# Patient Record
Sex: Female | Born: 1945 | Race: White | Hispanic: No | Marital: Married | Smoking: Never smoker
Health system: Southern US, Community
[De-identification: ages and names within clinical notes are randomized; demographics above are authoritative.]

## PROBLEM LIST (undated history)

## (undated) DIAGNOSIS — G2581 Restless legs syndrome: Secondary | ICD-10-CM

## (undated) DIAGNOSIS — M1990 Unspecified osteoarthritis, unspecified site: Secondary | ICD-10-CM

## (undated) DIAGNOSIS — Z9889 Other specified postprocedural states: Secondary | ICD-10-CM

## (undated) DIAGNOSIS — R112 Nausea with vomiting, unspecified: Secondary | ICD-10-CM

## (undated) HISTORY — PX: TONSILLECTOMY: SUR1361

## (undated) HISTORY — PX: APPENDECTOMY: SHX54

## (undated) HISTORY — PX: TUBAL LIGATION: SHX77

---

## 1997-12-22 ENCOUNTER — Other Ambulatory Visit: Admission: RE | Admit: 1997-12-22 | Discharge: 1997-12-22 | Payer: Self-pay | Admitting: Obstetrics and Gynecology

## 2000-01-16 ENCOUNTER — Encounter: Admission: RE | Admit: 2000-01-16 | Discharge: 2000-01-16 | Payer: Self-pay | Admitting: Obstetrics and Gynecology

## 2000-01-16 ENCOUNTER — Encounter: Payer: Self-pay | Admitting: Obstetrics and Gynecology

## 2001-01-16 ENCOUNTER — Encounter: Payer: Self-pay | Admitting: Obstetrics and Gynecology

## 2001-01-16 ENCOUNTER — Encounter: Admission: RE | Admit: 2001-01-16 | Discharge: 2001-01-16 | Payer: Self-pay | Admitting: Obstetrics and Gynecology

## 2002-01-17 ENCOUNTER — Encounter: Payer: Self-pay | Admitting: Obstetrics and Gynecology

## 2002-01-17 ENCOUNTER — Encounter: Admission: RE | Admit: 2002-01-17 | Discharge: 2002-01-17 | Payer: Self-pay | Admitting: Obstetrics and Gynecology

## 2002-02-19 ENCOUNTER — Other Ambulatory Visit: Admission: RE | Admit: 2002-02-19 | Discharge: 2002-02-19 | Payer: Self-pay | Admitting: Obstetrics and Gynecology

## 2002-12-20 ENCOUNTER — Encounter (INDEPENDENT_AMBULATORY_CARE_PROVIDER_SITE_OTHER): Payer: Self-pay | Admitting: *Deleted

## 2002-12-20 ENCOUNTER — Ambulatory Visit (HOSPITAL_COMMUNITY): Admission: RE | Admit: 2002-12-20 | Discharge: 2002-12-20 | Payer: Self-pay | Admitting: Obstetrics and Gynecology

## 2003-01-26 ENCOUNTER — Encounter: Admission: RE | Admit: 2003-01-26 | Discharge: 2003-01-26 | Payer: Self-pay | Admitting: Obstetrics and Gynecology

## 2003-01-26 ENCOUNTER — Encounter: Payer: Self-pay | Admitting: Obstetrics and Gynecology

## 2003-03-16 ENCOUNTER — Other Ambulatory Visit: Admission: RE | Admit: 2003-03-16 | Discharge: 2003-03-16 | Payer: Self-pay | Admitting: Obstetrics and Gynecology

## 2004-03-28 ENCOUNTER — Other Ambulatory Visit: Admission: RE | Admit: 2004-03-28 | Discharge: 2004-03-28 | Payer: Self-pay | Admitting: Obstetrics and Gynecology

## 2005-05-03 ENCOUNTER — Other Ambulatory Visit: Admission: RE | Admit: 2005-05-03 | Discharge: 2005-05-03 | Payer: Self-pay | Admitting: Obstetrics and Gynecology

## 2008-07-29 ENCOUNTER — Ambulatory Visit: Payer: Self-pay | Admitting: Vascular Surgery

## 2009-01-04 ENCOUNTER — Emergency Department (HOSPITAL_COMMUNITY): Admission: EM | Admit: 2009-01-04 | Discharge: 2009-01-04 | Payer: Self-pay | Admitting: Emergency Medicine

## 2010-10-27 ENCOUNTER — Ambulatory Visit
Admission: RE | Admit: 2010-10-27 | Discharge: 2010-10-27 | Disposition: A | Discharge status: Home / Self Care | Payer: BC Managed Care – PPO | Source: Ambulatory Visit | Attending: Family Medicine | Admitting: Family Medicine

## 2010-10-27 ENCOUNTER — Other Ambulatory Visit: Payer: Self-pay | Admitting: Family Medicine

## 2010-10-27 DIAGNOSIS — R52 Pain, unspecified: Secondary | ICD-10-CM

## 2011-01-03 NOTE — Assessment & Plan Note (Signed)
OFFICE VISIT   [name], [name]  DOB:  1946/03/10                                       07/29/2008  CHART#:[id number]   The patient is a 65 year old female referred for evaluation for  abdominal aortic aneurysm.  Her mother died of a ruptured abdominal  aortic aneurysm at age 56.  The patient's atherosclerotic risk factors  include family history and history of smoking.  She denies history of  coronary artery disease, hypertension, diabetes or elevated cholesterol.  She has no other first degree relatives that she knows of that had an  aneurysm.   PHYSICAL EXAMINATION:  Today, blood pressure is 154/80 in the left arm,  pulse is 75 and regular, respirations 18.  Abdomen:  Soft, nontender,  nondistended with no evidence of pulsatile mass.  She has 2+ dorsalis  pedis and posterior tibial pulses bilaterally.  She had an aortic  ultrasound today which showed her maximum aortic diameter was 1.9 cm,  which is within normal limits.  She had no evidence of abdominal or  iliac aneurysm.   I counseled the patient today that she should have a repeat ultrasound  in 5 years to make sure that she has no aneurysm.  If she has no  aneurysm at age 62, the risk of developing one long-term should be  fairly low.  I also counseled her about refraining from smoking and  modification of atherosclerotic risk factors and discussed with her risk  factors that were related to abdominal aortic aneurysm development.  She  will follow up in 5 years' time for a repeat ultrasound.   Janetta Hora. Fields, MD  Electronically Signed   CEF/MEDQ  D:  07/29/2008  T:  07/30/2008  Job:  [id number]   cc:   Duncan Dull, M.D.  Richard M. Marcelle Overlie, M.D.

## 2011-01-03 NOTE — Procedures (Signed)
DUPLEX ULTRASOUND OF ABDOMINAL AORTA   INDICATION:  Family history of abdominal aortic aneurysm.   HISTORY:  Diabetes:  Yes.  Cardiac:  No.  Hypertension:  Yes.  Smoking:  Quit.  Connective Tissue Disorder:  Family History:  Mother died of abdominal aortic aneurysm.  Previous Surgery:   DUPLEX EXAM:         AP (cm)                   TRANSVERSE (cm)  Proximal             1.78 cm                   1.80 cm  Mid                  1.90 cm                   1.84 cm  Distal               1.66 cm                   1.52 cm  Right Iliac          1.18 cm                   1.04 cm  Left Iliac           1.29 cm                   0.90 cm   PREVIOUS:  Date:  AP:  TRANSVERSE:   IMPRESSION:  No evidence of abdominal aortic aneurysm noted.   ___________________________________________  Janetta Hora Fields, MD   MG/MEDQ  D:  07/29/2008  T:  07/29/2008  Job:  [id number]

## 2011-01-06 NOTE — Op Note (Signed)
   NAME:  Katie [name], Katie [name]                       ACCOUNT NO.:  [id number]   MEDICAL RECORD NO.:  [id number]                   PATIENT TYPE:  AMB   LOCATION:  SDC                                  FACILITY:  [facility]   PHYSICIAN:  Duke Salvia. Marcelle Overlie, M.D.            DATE OF BIRTH:  Jul 01, 1946   DATE OF PROCEDURE:  12/20/2002  DATE OF DISCHARGE:                                 OPERATIVE REPORT   PREOPERATIVE DIAGNOSES:  1. Hyperplasia.  2. Abnormal bleeding.   POSTOPERATIVE DIAGNOSES:  1. Hyperplasia.  2. Abnormal bleeding.  3. Endometrial polyp.   PROCEDURE:  Dilatation and curettage with hysteroscopy.   SURGEON:  Duke Salvia. Marcelle Overlie, M.D.   ANESTHESIA:  Paracervical block with sedation.   PROCEDURE AND FINDINGS:  The patient had been taken to the operating room.  After an adequate level of sedation was obtained with the patient's legs in  stirrups, the perineum and vagina were prepped and draped sterilely with  Betadine, the bladder was drained, [name] UA carried out, the uterus midposition,  normal size, mobile, adnexa negative.  The speculum was positioned, the  cervix grasped with [name] tenaculum, paracervical block created by infiltrating  at 3 and 9 o'clock submucosally.  Xylocaine 1% 5-7 mL on either side after  negative aspiration.  The uterus was then sounded to 8 cm, progressively  dilated to [name] 29 Pratt.  [name] 7 mm continuous-flow diagnostic hysteroscope was  then inserted.  Several polyps were noted in the lower segment.  The scope  was removed.  D&C was carried out with removal of some polypoid tissue.  When no further tissue was removed and the walls were felt to be clean, the  scope was reinserted, the cavity irrigated and inspected carefully.  The  polyps had been removed.  There were no other abnormalities noted.  She  tolerated this well and went to the recovery room in good condition.                                               Richard M. Marcelle Overlie, M.D.    RMH/MEDQ  D:   12/20/2002  T:  12/20/2002  Job:  [id number]

## 2011-02-24 ENCOUNTER — Ambulatory Visit (HOSPITAL_BASED_OUTPATIENT_CLINIC_OR_DEPARTMENT_OTHER)
Admission: RE | Admit: 2011-02-24 | Discharge: 2011-02-24 | Disposition: A | Discharge status: Home / Self Care | Payer: BC Managed Care – PPO | Source: Ambulatory Visit | Attending: Specialist | Admitting: Specialist

## 2011-02-24 DIAGNOSIS — M23359 Other meniscus derangements, posterior horn of lateral meniscus, unspecified knee: Secondary | ICD-10-CM | POA: Insufficient documentation

## 2011-02-24 DIAGNOSIS — Z01810 Encounter for preprocedural cardiovascular examination: Secondary | ICD-10-CM | POA: Insufficient documentation

## 2011-02-24 DIAGNOSIS — M23305 Other meniscus derangements, unspecified medial meniscus, unspecified knee: Secondary | ICD-10-CM | POA: Insufficient documentation

## 2011-02-24 DIAGNOSIS — Z01812 Encounter for preprocedural laboratory examination: Secondary | ICD-10-CM | POA: Insufficient documentation

## 2011-02-24 DIAGNOSIS — M1710 Unilateral primary osteoarthritis, unspecified knee: Secondary | ICD-10-CM | POA: Insufficient documentation

## 2011-02-24 DIAGNOSIS — M2240 Chondromalacia patellae, unspecified knee: Secondary | ICD-10-CM | POA: Insufficient documentation

## 2011-02-24 LAB — POCT HEMOGLOBIN-HEMACUE: Hemoglobin: 12.7 g/dL (ref 12.0–15.0)

## 2011-03-05 NOTE — Op Note (Signed)
Katie [name], Katie [name]            ACCOUNT NO.:  [id number]  MEDICAL RECORD NO.:  [id number]  LOCATION:                                 FACILITY:  PHYSICIAN:  Jene Every, M.D.         DATE OF BIRTH:  DATE OF PROCEDURE:  02/24/2011 DATE OF DISCHARGE:                              OPERATIVE REPORT   PREOPERATIVE DIAGNOSES: 1. Lateral meniscus tear. 2. Degenerative joint disease.  POSTOPERATIVE DIAGNOSES: 1. Complex tear lateral meniscus. 2. 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  PROCEDURES PERFORMED: 1. Right knee arthroscopy. 2. Partial medial and lateral meniscectomies. 3. Chondroplasty of the medial femoral condyle, lateral femoral     condyle.  ANESTHESIA:  General.  BRIEF HISTORY:  A 65 year old, locking, popping, giving way.  MRI indicating complex tear of lateral meniscus, minor degenerative changes, refractory conservative treatment, mechanical symptoms, indicated for arthroscopic partial meniscectomy and debridement.  Risks and benefits discussed, including bleeding, infection, no change in symptoms, worsening symptoms, need for repeat debridement, DVT, PE, anesthetic complications, need for revision total knee.  TECHNIQUE:  Patient in supine position after induction of adequate general anesthesia, 1 g of Kefzol, the right lower extremity was prepped and draped in the usual sterile fashion.  A lateral parapatellar portal was fashioned with a #11 blade.  Ingress cannula was atraumatically placed.  Irrigant was utilized to insufflate the joint.  Under direct visualization, a medial parapatellar portal was fashioned with a #11 blade sparing the medial meniscus after localization with an 18 gauge needle.  Examination of the medial compartment revealed some minor grade III changes of the femoral condyles, small areas of  radial tearing of the medial meniscus.  Katie [name] was introduced, a 3.5 Cuda shaver, and debrided the meniscus tear to a stable base.  Light chondroplasty was performed of the femoral condyle.  We evacuated loose cartilaginous debris.  The remnant of the meniscus was stable to probe palpation.  Examination of the intercondylar notch revealed normal ACL, hyperemic, intact.  Examination of the lateral compartment revealed extensive complex tear of the posterior half to third of the lateral meniscus.  This was complex tear, multiple cleavage planes back to the meniscocapsular junction.  There was loose cartilaginous debris, a small grade IV 4 lesion on the posterior aspect of the femoral condyle, grade III lesion noted as well.  Light chondroplasty was performed of the femoral condyle.  I introduced the basket rongeur and resected the meniscus to a stable base further contoured with a 3.5 Cuda shaver and an ArthroWand. The remnant, which was approximately 30% of the posterior third was stable to probe palpation.  With flexion extended the joint, there was no interpositioning.  There was no further loose cartilaginous debris.  Next, examined the suprapatellar pouch, minor grade III changes, normal patellofemoral tracking, light chondroplasty performed of the patella. Gutters were unremarkable.  Copiously lavaged and reexamined all compartments.  No further pathology amenable for arthroscopic intervention and therefore removed all instrumentation.  Portals were closed with 4-0 nylon simple sutures, 0.25%  Marcaine with epinephrine was infiltrated in the joint.  The wound was dressed sterilely.  Awoken without difficulty, transported to the recovery room in satisfactory condition.  The patient tolerated the procedure well.  No complications.  No assistant.     Jene Every, M.D.     Katie [name]  D:  02/24/2011  T:  02/24/2011  Job:  [id number]  Electronically Signed by Jene Every M.D.  on 03/05/2011 09:53:36 AM

## 2012-08-08 ENCOUNTER — Emergency Department (HOSPITAL_COMMUNITY)
Admission: EM | Admit: 2012-08-08 | Discharge: 2012-08-08 | Disposition: A | Discharge status: Home / Self Care | Payer: BC Managed Care – PPO | Attending: Emergency Medicine | Admitting: Emergency Medicine

## 2012-08-08 ENCOUNTER — Emergency Department (HOSPITAL_COMMUNITY): Payer: BC Managed Care – PPO

## 2012-08-08 ENCOUNTER — Encounter (HOSPITAL_COMMUNITY): Payer: Self-pay | Admitting: *Deleted

## 2012-08-08 DIAGNOSIS — S42209A Unspecified fracture of upper end of unspecified humerus, initial encounter for closed fracture: Secondary | ICD-10-CM | POA: Insufficient documentation

## 2012-08-08 DIAGNOSIS — Z7982 Long term (current) use of aspirin: Secondary | ICD-10-CM | POA: Insufficient documentation

## 2012-08-08 DIAGNOSIS — R110 Nausea: Secondary | ICD-10-CM | POA: Insufficient documentation

## 2012-08-08 DIAGNOSIS — S0181XA Laceration without foreign body of other part of head, initial encounter: Secondary | ICD-10-CM

## 2012-08-08 DIAGNOSIS — R42 Dizziness and giddiness: Secondary | ICD-10-CM | POA: Insufficient documentation

## 2012-08-08 DIAGNOSIS — W1809XA Striking against other object with subsequent fall, initial encounter: Secondary | ICD-10-CM | POA: Insufficient documentation

## 2012-08-08 DIAGNOSIS — Y9389 Activity, other specified: Secondary | ICD-10-CM | POA: Insufficient documentation

## 2012-08-08 DIAGNOSIS — Y929 Unspecified place or not applicable: Secondary | ICD-10-CM | POA: Insufficient documentation

## 2012-08-08 DIAGNOSIS — Z79899 Other long term (current) drug therapy: Secondary | ICD-10-CM | POA: Insufficient documentation

## 2012-08-08 DIAGNOSIS — Z8669 Personal history of other diseases of the nervous system and sense organs: Secondary | ICD-10-CM | POA: Insufficient documentation

## 2012-08-08 DIAGNOSIS — S0180XA Unspecified open wound of other part of head, initial encounter: Secondary | ICD-10-CM | POA: Insufficient documentation

## 2012-08-08 HISTORY — DX: Restless legs syndrome: G25.81

## 2012-08-08 MED ORDER — MORPHINE SULFATE 4 MG/ML IJ SOLN
4.0000 mg | Freq: Once | INTRAMUSCULAR | Status: AC
  Administered 2012-08-08: 4 mg via INTRAMUSCULAR

## 2012-08-08 MED ORDER — ONDANSETRON HCL 4 MG/2ML IJ SOLN
4.0000 mg | Freq: Once | INTRAMUSCULAR | Status: AC
  Administered 2012-08-08: 4 mg via INTRAVENOUS
  Filled 2012-08-08: qty 2

## 2012-08-08 MED ORDER — MORPHINE SULFATE 4 MG/ML IJ SOLN
INTRAMUSCULAR | Status: AC
  Filled 2012-08-08: qty 1

## 2012-08-08 MED ORDER — FENTANYL CITRATE 0.05 MG/ML IJ SOLN
50.0000 ug | Freq: Once | INTRAMUSCULAR | Status: AC
  Administered 2012-08-08: 50 ug via INTRAVENOUS

## 2012-08-08 MED ORDER — FENTANYL CITRATE 0.05 MG/ML IJ SOLN
100.0000 ug | Freq: Once | INTRAMUSCULAR | Status: AC
  Administered 2012-08-08: 100 ug via INTRAVENOUS

## 2012-08-08 MED ORDER — HYDROCODONE-ACETAMINOPHEN 5-325 MG PO TABS: 1.0000 | ORAL_TABLET | ORAL | Status: DC | PRN

## 2012-08-08 MED ORDER — ONDANSETRON HCL 4 MG PO TABS: 4.0000 mg | ORAL_TABLET | Freq: Four times a day (QID) | ORAL | Status: DC

## 2012-08-08 MED ORDER — FENTANYL CITRATE 0.05 MG/ML IJ SOLN
50.0000 ug | Freq: Once | INTRAMUSCULAR | Status: AC
  Administered 2012-08-08: 50 ug via INTRAVENOUS
  Filled 2012-08-08: qty 2

## 2012-08-08 MED ORDER — FENTANYL CITRATE 0.05 MG/ML IJ SOLN
INTRAMUSCULAR | Status: AC
  Filled 2012-08-08: qty 2

## 2012-08-08 NOTE — ED Notes (Signed)
Pt felt nauseous with some dizziness upon sitting up; pt asked to take a couple of breaths; upon arrival outside to vehicle, pt stated that she felt better with fresh air; pt more alert, respirations 16; pt d/c with daughter

## 2012-08-08 NOTE — ED Notes (Signed)
Patient is still out of her room
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
## 2012-08-08 NOTE — Progress Notes (Signed)
Orthopedic Tech Progress Note Patient Details:  Katie [name] September 29, 1945 [id number]  Ortho Devices Type of Ortho Device: Arm sling;Post (long arm) splint Ortho Device/Splint Location: RIGHT LONG ARM SPLILNT AND ARM SLING Ortho Device/Splint Interventions: Application   Cammer, Mickie Bail 08/08/2012, 1:56 PM

## 2012-08-08 NOTE — ED Notes (Signed)
Patient remains in CT at this time 

## 2012-08-08 NOTE — ED Notes (Signed)
Patient fell down 5 steps and landed on right shoulder.  Deformity to right shoulder, + pulses, lac to forehead.

## 2012-08-08 NOTE — ED Provider Notes (Signed)
LACERATION REPAIR Performed by: Carolee Rota Authorized by: Carolee Rota Consent: Verbal consent obtained. Risks and benefits: risks, benefits and alternatives were discussed Consent given by: patient Patient identity confirmed: provided demographic data Prepped and Draped in normal sterile fashion Wound explored  Laceration Location: Left forehead  Laceration Length: 3cm, stellate  No Foreign Bodies seen or palpated  Anesthesia: local infiltration  Local anesthetic: lidocaine 2% with epinephrine  Anesthetic total: 3 ml  Irrigation method: skin scrub with dermal cleanser Amount of cleaning: standard  Skin closure: 6-0 Vicryl #2; 6-0 Prolene #7  Number of sutures: 9 total  Technique: simple interrupted  Patient tolerance: Patient tolerated the procedure well with no immediate complications.   Renne Crigler, [place] 08/08/12 1510

## 2012-08-11 NOTE — ED Provider Notes (Signed)
Medical screening examination/treatment/procedure(s) were conducted as a shared visit with non-physician practitioner(s) and myself.  I personally evaluated the patient during the encounter   Esthela Brandner, MD 08/11/12 0006 

## 2012-12-09 ENCOUNTER — Other Ambulatory Visit: Payer: Self-pay

## 2012-12-09 DIAGNOSIS — Z1231 Encounter for screening mammogram for malignant neoplasm of breast: Secondary | ICD-10-CM

## 2013-01-08 ENCOUNTER — Ambulatory Visit
Admission: RE | Admit: 2013-01-08 | Discharge: 2013-01-08 | Disposition: A | Discharge status: Home / Self Care | Payer: BC Managed Care – PPO | Source: Ambulatory Visit

## 2013-01-08 DIAGNOSIS — Z1231 Encounter for screening mammogram for malignant neoplasm of breast: Secondary | ICD-10-CM

## 2013-08-25 ENCOUNTER — Encounter: Payer: Self-pay | Admitting: Podiatry

## 2013-08-25 ENCOUNTER — Ambulatory Visit (INDEPENDENT_AMBULATORY_CARE_PROVIDER_SITE_OTHER): Payer: BC Managed Care – PPO

## 2013-08-25 ENCOUNTER — Ambulatory Visit (INDEPENDENT_AMBULATORY_CARE_PROVIDER_SITE_OTHER): Payer: BC Managed Care – PPO | Admitting: Podiatry

## 2013-08-25 VITALS — BP 144/76 | HR 87 | Resp 12

## 2013-08-25 DIAGNOSIS — R52 Pain, unspecified: Secondary | ICD-10-CM

## 2013-08-25 DIAGNOSIS — S93609A Unspecified sprain of unspecified foot, initial encounter: Secondary | ICD-10-CM

## 2013-08-25 NOTE — Patient Instructions (Signed)
Wear boot on left foot except when showering and sleeping.

## 2013-08-25 NOTE — Progress Notes (Signed)
   Subjective:    Patient ID: Katie [name], female    DOB: 11-11-1945, 68 y.o.   MRN: [id number]  Foot Pain This is a new problem. The current episode started more than 1 month ago. The problem occurs constantly. The problem has been unchanged. Associated symptoms include joint swelling. The symptoms are aggravated by walking and standing. She has tried ice and heat for the symptoms. The treatment provided no relief.   This patient was last seen on 06/02/2012 with a complaint of left midfoot pain. At that time a diagnosis of the foot sprain was diagnosed and accommodative rigid orthotic was advised. The patient presents again complaining of left midfoot pain become progressively more painful in the last month forcing her to limp when she walks.    Review of Systems  Musculoskeletal: Positive for joint swelling.       Objective:   Physical Exam  Orientated x60 68 year old white female  Vascular: DP and PT pulses are two over four bilaterally  Neurological: Deferred  Dermatological: Texture and turgor within normal limits  Musculoskeletal: Restriction ankle, subtalar, midtarsal joints bilaterally. Palpable tenderness dorsal midfoot left duplicates her area of discomfort, without any palpable lesions.   X-ray report left foot weightbearing 08/26/2013  Intact bony structure without fracture or dislocation noted.  Radiographic impression: No acute bony abnormality noted         Assessment & Plan:    Assessment: Midfoot sprain left foot with possible stress fracture  Plan: Patient placed in Cam Walker boot with instructions to wear on a continuous basis except when showering and sleeping. Reevaluate in 6 weeks. If symptoms persist consider MRI image.

## 2013-10-06 ENCOUNTER — Ambulatory Visit: Payer: BC Managed Care – PPO | Admitting: Podiatry

## 2013-10-15 ENCOUNTER — Encounter: Payer: Self-pay | Admitting: Podiatry

## 2013-10-15 ENCOUNTER — Ambulatory Visit (INDEPENDENT_AMBULATORY_CARE_PROVIDER_SITE_OTHER): Payer: BC Managed Care – PPO | Admitting: Podiatry

## 2013-10-15 ENCOUNTER — Other Ambulatory Visit: Payer: Self-pay | Admitting: *Deleted

## 2013-10-15 ENCOUNTER — Ambulatory Visit (INDEPENDENT_AMBULATORY_CARE_PROVIDER_SITE_OTHER): Payer: BC Managed Care – PPO

## 2013-10-15 VITALS — BP 134/74 | HR 82 | Resp 12

## 2013-10-15 DIAGNOSIS — M79609 Pain in unspecified limb: Secondary | ICD-10-CM

## 2013-10-15 DIAGNOSIS — S93609A Unspecified sprain of unspecified foot, initial encounter: Secondary | ICD-10-CM

## 2013-10-15 DIAGNOSIS — M779 Enthesopathy, unspecified: Secondary | ICD-10-CM

## 2013-10-15 DIAGNOSIS — M79606 Pain in leg, unspecified: Secondary | ICD-10-CM

## 2013-10-15 NOTE — Patient Instructions (Signed)
Continue to wear boot on left foot until the results of the MRI are available.

## 2013-10-16 NOTE — Progress Notes (Signed)
Patient ID: Katie [name], female   DOB: 08-May-1946, 68 y.o.   MRN: [id number]  Subjective: This patient presents for ongoing left mid foot pain either of evaluation since October 2013. The symptoms are aggravated with weightbearing and relieved with rest. On the visit of 08/26/2013 a Cam Walker boot was dispensed to wear on the left foot. Patient states that when she wears the boot the pain is controlled, however, when she removes the boot in stance the left midfoot pain recurs.  Objective: Vascular: DP and PT pulses 2/4 bilaterally  Dermatological: Texture and turgor within normal limits  Neurological: Deferred  Musculoskeletal: Patient has palpable tenderness in the dorsal left midfoot and medial navicular area without any palpable lesions.  X-ray report weightbearing left foot  A small curvilinear area suggestive of possible accessory bone or possible fracture noted in the medial navicular area. This was noted in the lateral and AP view. The remaining bone quality is unremarkable. There is a small spur formation noted the base of the fourth metatarsal cuboid area.  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 Order noncontrast MRI for the indication of painful left midfoot with suspicious area in navicular noted on on the x-ray of every 26 2015.  Also will obtain digital scan for rigid foot orthotic for the indication of midfoot pain/arthritis/fracture?

## 2013-10-17 ENCOUNTER — Telehealth: Payer: Self-pay | Admitting: *Deleted

## 2013-10-17 NOTE — Telephone Encounter (Signed)
Prior authorization #[id number] valid through 11/15/2013.  Faxed to [place] [phone].

## 2013-10-20 ENCOUNTER — Telehealth: Payer: Self-pay | Admitting: *Deleted

## 2013-10-20 NOTE — Telephone Encounter (Addendum)
Pt states the co-pay for the MRI will be $800.00, can a CT Scan be ordered.  I spoke with [place] states [employer] allowable for CT scan is $499.38.  Pt states the CT scan, may be something she would like to do if DR [facility] felt she would benefit.  I will inform Dr. Amalia Hailey and call pt.  Dr Amalia Hailey said schedule CT Scan of left foot.  Ordered CT Scan and informed pt.  Pt sates she would like to wait until she had the orthotics and see how she progressed.  CT Scan cancelled.

## 2013-10-21 ENCOUNTER — Other Ambulatory Visit: Payer: Self-pay | Admitting: *Deleted

## 2013-10-21 DIAGNOSIS — M79609 Pain in unspecified limb: Secondary | ICD-10-CM

## 2013-10-22 ENCOUNTER — Other Ambulatory Visit: Payer: Self-pay

## 2013-11-07 ENCOUNTER — Telehealth: Payer: Self-pay | Admitting: *Deleted

## 2013-11-07 NOTE — Telephone Encounter (Signed)
Pt asked status of orthotics.  I informed her it could take 4 - 6 weeks and once in we would send her a notice.

## 2013-11-10 ENCOUNTER — Telehealth: Payer: Self-pay | Admitting: *Deleted

## 2013-11-10 NOTE — Telephone Encounter (Signed)
I called and left her a message that her orthotics are here.  Please call and schedule an appointment to pick them up.

## 2013-11-17 ENCOUNTER — Encounter: Payer: Self-pay | Admitting: Podiatry

## 2013-11-17 ENCOUNTER — Ambulatory Visit (INDEPENDENT_AMBULATORY_CARE_PROVIDER_SITE_OTHER): Payer: BC Managed Care – PPO | Admitting: Podiatry

## 2013-11-17 VITALS — BP 132/69 | HR 86 | Resp 12

## 2013-11-17 DIAGNOSIS — M779 Enthesopathy, unspecified: Secondary | ICD-10-CM

## 2013-11-17 NOTE — Patient Instructions (Addendum)

## 2013-11-18 NOTE — Progress Notes (Signed)
Patient ID: Katie [name], female   DOB: 05-Jan-1946, 68 y.o.   MRN: [id number]  Subjective: This patient presents for followup care and dispensing of orthotics in treating left midfoot pain. Patient did not have the MRI I ordered. She said she wanted to see how her foot felt after wearing orthotics.  Objective: Custom orthotics contour satisfactorily  Assessment: Satisfactory fit of orthotics in treatment of midfoot pain left  Plan: Orthotics are dispenses where instructions provided. Reappoint x6 weeks

## 2013-12-01 ENCOUNTER — Other Ambulatory Visit: Payer: Self-pay

## 2013-12-01 DIAGNOSIS — Z1231 Encounter for screening mammogram for malignant neoplasm of breast: Secondary | ICD-10-CM

## 2013-12-26 ENCOUNTER — Other Ambulatory Visit: Payer: Self-pay | Admitting: Family Medicine

## 2013-12-26 DIAGNOSIS — Z780 Asymptomatic menopausal state: Secondary | ICD-10-CM

## 2013-12-29 ENCOUNTER — Ambulatory Visit (INDEPENDENT_AMBULATORY_CARE_PROVIDER_SITE_OTHER): Payer: BC Managed Care – PPO | Admitting: Podiatry

## 2013-12-29 ENCOUNTER — Encounter: Payer: Self-pay | Admitting: Podiatry

## 2013-12-29 VITALS — BP 156/78 | HR 94 | Resp 18 | Ht 66.0 in | Wt 150.0 lb

## 2013-12-29 DIAGNOSIS — M779 Enthesopathy, unspecified: Secondary | ICD-10-CM

## 2013-12-29 NOTE — Patient Instructions (Signed)
Continue to wear  the custom foot orthotics in an athletic style shoe on a continuous basis.

## 2013-12-29 NOTE — Progress Notes (Signed)
Patient ID: Katie [name], female   DOB: [birth year], 68 y.o.   MRN: [id number]  Subjective: This orientated x3 white female presents for followup evaluation of left midfoot pain. She is wearing custom rigid orthotics dispensed on 11/18/2013 on a continuous basis. She states that the left foot is essentially pain-free when wearing orthotics and when she removes the orthotic the left midfoot pain recurs. She is able to increase her activity when she wears orthotics.  Objective: The left foot is unremarkable in appearance. There no skin lesions noted. There is mild palpable tenderness in the dorsal midfoot area without a palpable lesions. There is no restriction ankle, subtalar, midtarsal joint on the left foot. The custom orthotics contour satisfactorily.  Assessment: Chronic left midfoot arthralgia/capsulitis  Plan: Patient will continue to wear a custom orthotic in an athletic style shoe on a continuous basis and adjust her activity to her tolerance.  Reappoint at patient's request.

## 2014-01-09 ENCOUNTER — Telehealth: Payer: Self-pay | Admitting: *Deleted

## 2014-01-09 ENCOUNTER — Ambulatory Visit: Payer: Self-pay

## 2014-01-09 ENCOUNTER — Other Ambulatory Visit: Payer: Self-pay

## 2014-01-09 NOTE — Telephone Encounter (Signed)
Message copied by Lolita Rieger on Fri Jan 09, 2014 12:22 PM ------      Message from: [place], [place] M      Created: Thu Jan 08, 2014  4:44 PM      Regarding: Question      Contact: [phone]       Briella is a Dr. Amalia Hailey patient that has a question. Stated you can call her back tomorrow. Thanks. ------

## 2014-01-09 NOTE — Telephone Encounter (Signed)
I attempted to return her call, left her a message to call me back.

## 2014-01-21 ENCOUNTER — Encounter (INDEPENDENT_AMBULATORY_CARE_PROVIDER_SITE_OTHER): Payer: Self-pay

## 2014-01-21 ENCOUNTER — Ambulatory Visit
Admission: RE | Admit: 2014-01-21 | Discharge: 2014-01-21 | Disposition: A | Discharge status: Home / Self Care | Payer: BC Managed Care – PPO | Source: Ambulatory Visit | Attending: Family Medicine | Admitting: Family Medicine

## 2014-01-21 ENCOUNTER — Ambulatory Visit
Admission: RE | Admit: 2014-01-21 | Discharge: 2014-01-21 | Disposition: A | Discharge status: Home / Self Care | Payer: BC Managed Care – PPO | Source: Ambulatory Visit

## 2014-01-21 DIAGNOSIS — Z780 Asymptomatic menopausal state: Secondary | ICD-10-CM

## 2014-01-21 DIAGNOSIS — Z1231 Encounter for screening mammogram for malignant neoplasm of breast: Secondary | ICD-10-CM

## 2014-05-22 DIAGNOSIS — Z23 Encounter for immunization: Secondary | ICD-10-CM | POA: Diagnosis not present

## 2014-06-14 IMAGING — CT CT HEAD W/O CM
3 of 5 series · 15 of 47 positions shown, 18 images · non-contrast
Comparison: None

CT HEAD

CLINICAL DATA: Head injury.  The patient fell and has a laceration
to the left frontal region.

CT HEAD WITHOUT CONTRAST
CT CERVICAL SPINE WITHOUT CONTRAST
TECHNIQUE: Multidetector CT imaging of the head and cervical spine
was performed following the standard protocol without intravenous
contrast.  Multiplanar CT image reconstructions of the cervical
spine were also generated.

[Series 602: sagittals · sagittal · 0.47mm/px · 3 of 48 slices shown]
[im 16/48  brain]
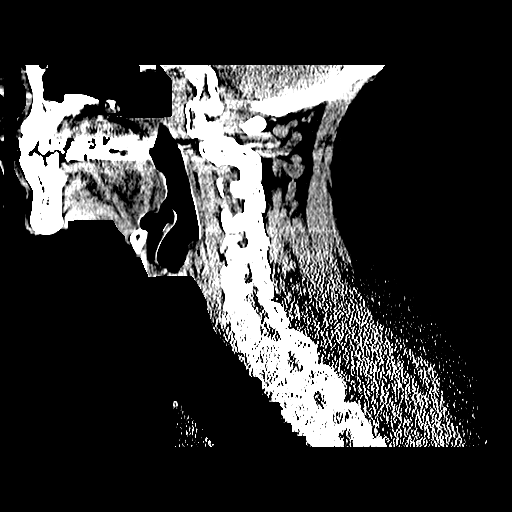
[im 24/48  brain]
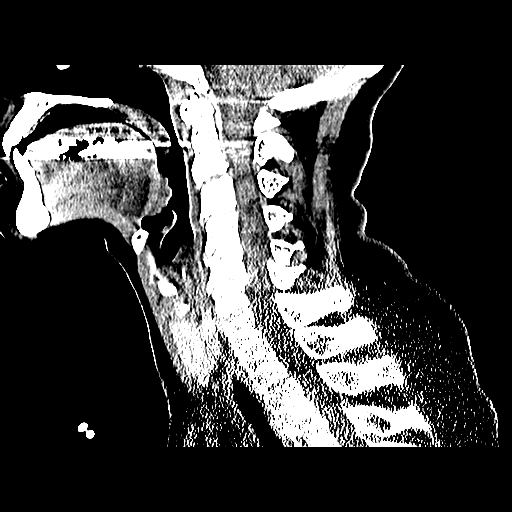
[im 32/48  brain]
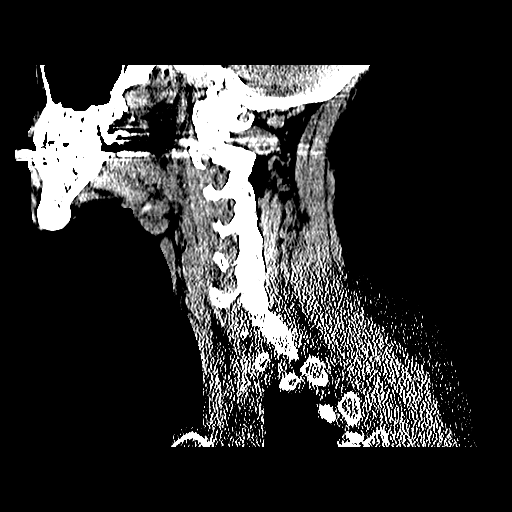

[Series 603: coronals · coronal · 0.47mm/px · 3 of 48 slices shown]
[im 16/48  brain]
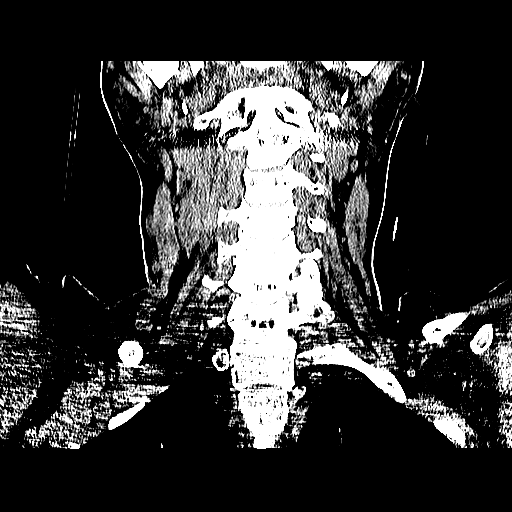
[im 21/48  brain]
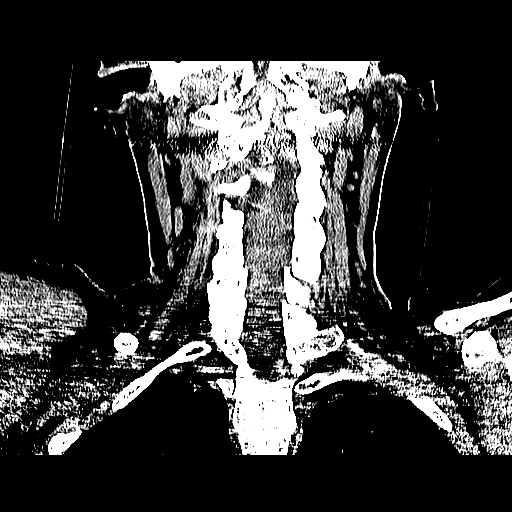
[im 27/48  brain]
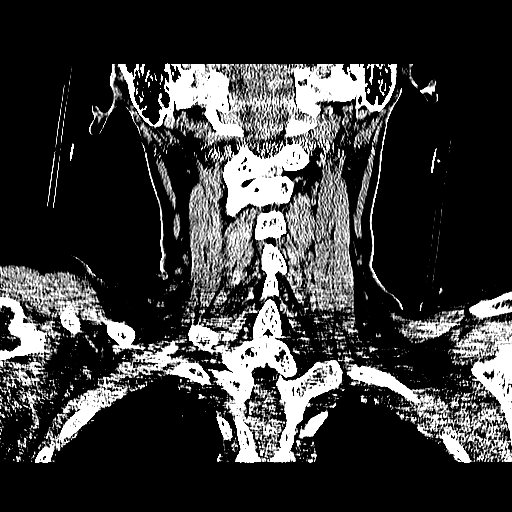

[Series 604: axials · axial · 0.47mm/px · z∈[-376,-206]mm · 9 of 110 slices shown, 12 images]
[im 9/110  brain]
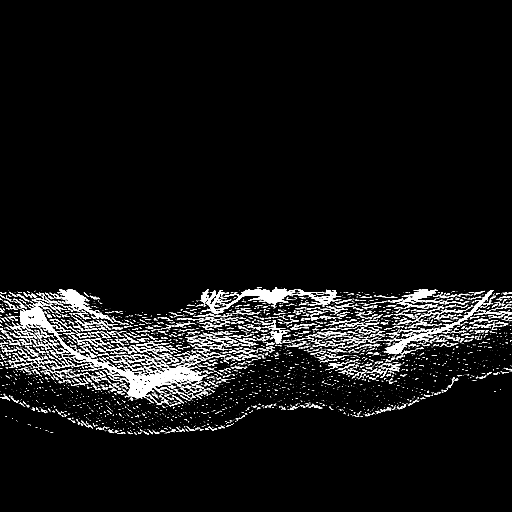
[im 9/110  bone]
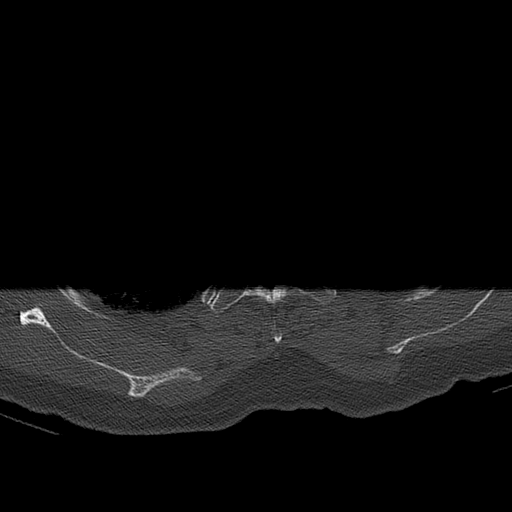
[im 26/110  brain]
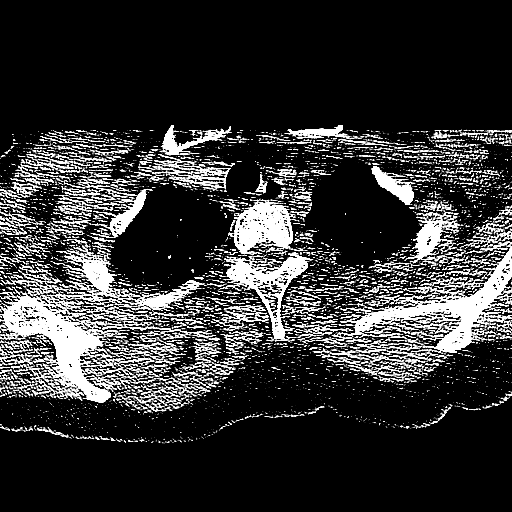
[im 34/110  brain]
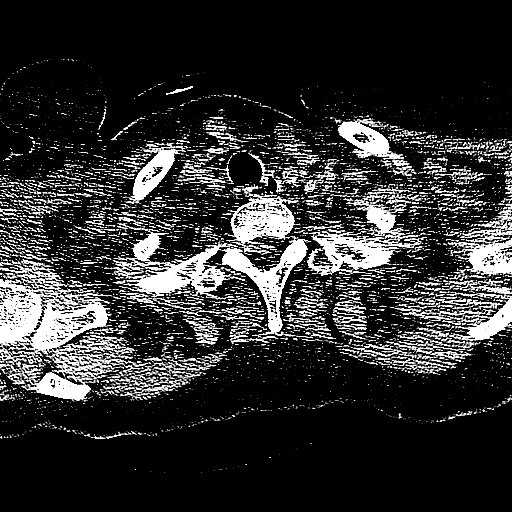
[im 42/110  brain]
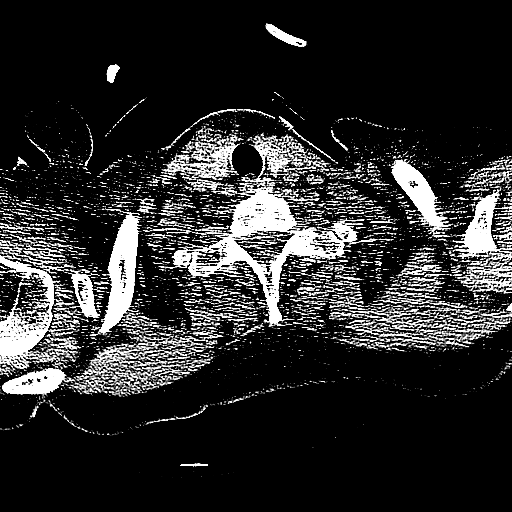
[im 59/110  brain]
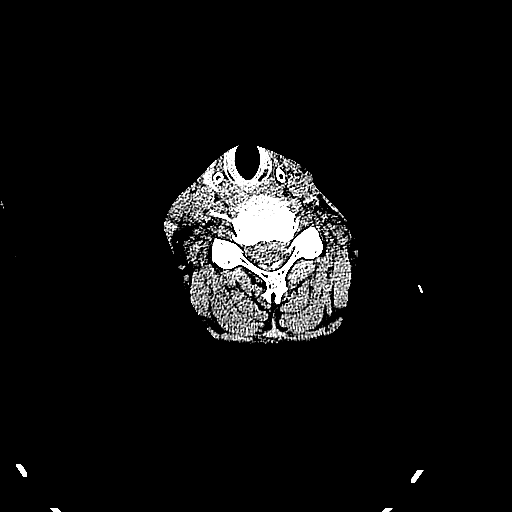
[im 59/110  bone]
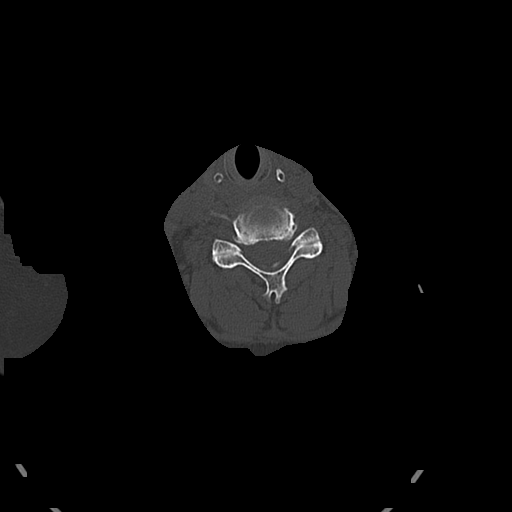
[im 68/110  brain]
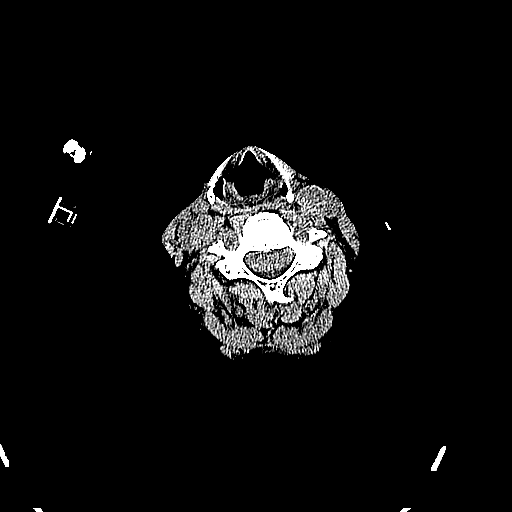
[im 76/110  brain]
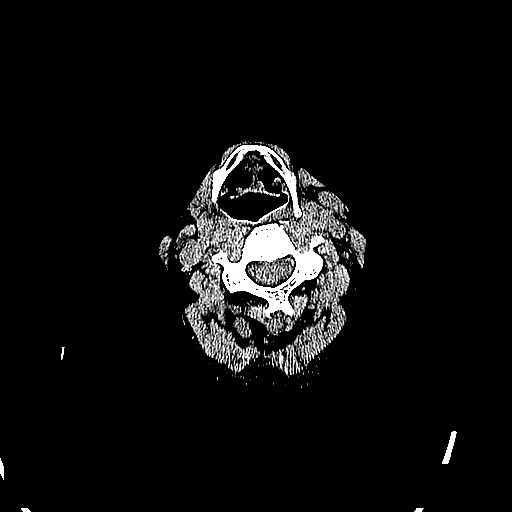
[im 93/110  brain]
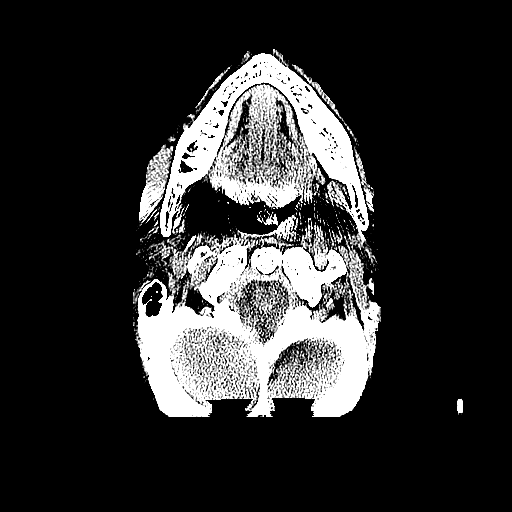
[im 101/110  brain]
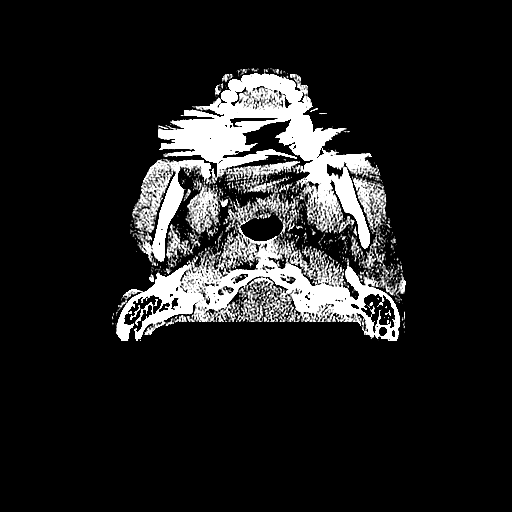
[im 101/110  bone]
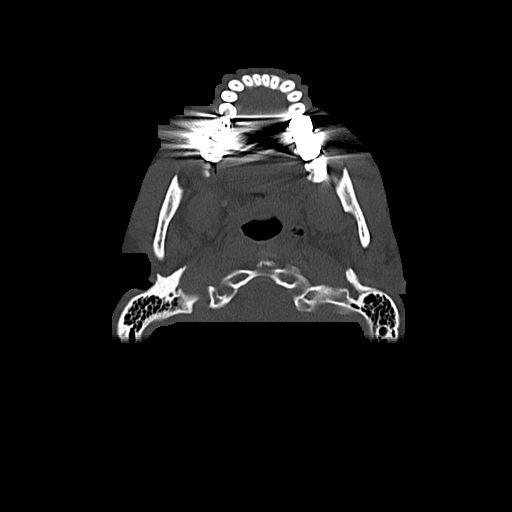

[15 of 47 positions shown; findings below may reference images not displayed]

FINDINGS: There is focal scalp swelling and skin laceration in the
left frontal region.

No acute intracranial abnormalities identified.  Specifically, no
intra or extra-axial hemorrhage, hydrocephalus, mass lesion, mass
effect, or evidence of acute cortically based infarction.

The skull is intact.  The imaged paranasal sinuses and mastoid air
cells are clear.  The orbital soft tissues are symmetric and
intact.
IMPRESSION: 1.  Left frontal scalp swelling and laceration.
2.  No acute intracranial abnormality.

CT CERVICAL SPINE
FINDINGS: Cervical spine is normally aligned from the skull base
through the superior endplate of the T3 vertebral body.  Small
anterior osteophytes are present at multiple levels.  There is
posterior osseous spurring at C4-5 and C5-6.  Vertebral body
heights are normal.   There is mild to moderate bilateral neural
foraminal narrowing at C5-C6. There is mild right neural foraminal
narrowing at C4-5.  Negative for acute cervical spine fracture.

The prevertebral soft tissue contours within normal limits.

There is bilateral pleural parenchymal thickening at both lung
apices.

There is nonspecific increased density and stranding in the
subcutaneous fat of the anterior neck, just inferior to the level
of the thyroid gland (images 80 through 83 of series 6).
IMPRESSION: 1.  Negative for acute bony injury to the cervical spine.
2.  Cervical spondylosis at C4-5 and C5-C6 as described above.
3.  Nonspecific density/stranding in the subcutaneous fat of the
anterior neck just inferior to the level of the thyroid gland.
Question if the patient has signs of acute trauma with swelling and
bruising in this region.

## 2014-11-06 DIAGNOSIS — M1711 Unilateral primary osteoarthritis, right knee: Secondary | ICD-10-CM | POA: Diagnosis not present

## 2014-11-06 DIAGNOSIS — M25561 Pain in right knee: Secondary | ICD-10-CM | POA: Diagnosis not present

## 2014-11-23 DIAGNOSIS — D239 Other benign neoplasm of skin, unspecified: Secondary | ICD-10-CM | POA: Diagnosis not present

## 2014-11-23 DIAGNOSIS — L814 Other melanin hyperpigmentation: Secondary | ICD-10-CM | POA: Diagnosis not present

## 2014-12-04 DIAGNOSIS — R194 Change in bowel habit: Secondary | ICD-10-CM | POA: Diagnosis not present

## 2014-12-04 DIAGNOSIS — M1711 Unilateral primary osteoarthritis, right knee: Secondary | ICD-10-CM | POA: Diagnosis not present

## 2014-12-04 DIAGNOSIS — R197 Diarrhea, unspecified: Secondary | ICD-10-CM | POA: Diagnosis not present

## 2014-12-20 HISTORY — PX: OTHER SURGICAL HISTORY: SHX169

## 2015-01-22 DIAGNOSIS — I788 Other diseases of capillaries: Secondary | ICD-10-CM | POA: Diagnosis not present

## 2015-01-25 DIAGNOSIS — Z136 Encounter for screening for cardiovascular disorders: Secondary | ICD-10-CM | POA: Diagnosis not present

## 2015-01-25 DIAGNOSIS — Z131 Encounter for screening for diabetes mellitus: Secondary | ICD-10-CM | POA: Diagnosis not present

## 2015-01-25 DIAGNOSIS — R202 Paresthesia of skin: Secondary | ICD-10-CM | POA: Diagnosis not present

## 2015-01-25 DIAGNOSIS — R030 Elevated blood-pressure reading, without diagnosis of hypertension: Secondary | ICD-10-CM | POA: Diagnosis not present

## 2015-01-25 DIAGNOSIS — Z0000 Encounter for general adult medical examination without abnormal findings: Secondary | ICD-10-CM | POA: Diagnosis not present

## 2015-02-01 ENCOUNTER — Other Ambulatory Visit: Payer: Self-pay

## 2015-02-01 DIAGNOSIS — Z1231 Encounter for screening mammogram for malignant neoplasm of breast: Secondary | ICD-10-CM

## 2015-02-05 ENCOUNTER — Ambulatory Visit
Admission: RE | Admit: 2015-02-05 | Discharge: 2015-02-05 | Disposition: A | Discharge status: Home / Self Care | Payer: Medicare Other | Source: Ambulatory Visit

## 2015-02-05 ENCOUNTER — Ambulatory Visit: Payer: Self-pay

## 2015-02-05 DIAGNOSIS — Z1231 Encounter for screening mammogram for malignant neoplasm of breast: Secondary | ICD-10-CM

## 2015-02-15 DIAGNOSIS — M1711 Unilateral primary osteoarthritis, right knee: Secondary | ICD-10-CM | POA: Diagnosis not present

## 2015-06-07 DIAGNOSIS — Z23 Encounter for immunization: Secondary | ICD-10-CM | POA: Diagnosis not present

## 2015-06-18 DIAGNOSIS — M25561 Pain in right knee: Secondary | ICD-10-CM | POA: Diagnosis not present

## 2015-07-20 ENCOUNTER — Other Ambulatory Visit: Payer: Self-pay | Admitting: Physician Assistant

## 2015-07-20 DIAGNOSIS — M25561 Pain in right knee: Secondary | ICD-10-CM | POA: Diagnosis not present

## 2015-07-20 NOTE — H&P (Signed)
TOTAL KNEE ADMISSION H&P  Patient is being admitted for right total knee arthroplasty.  Subjective:  Chief Complaint:right knee pain.  HPI: Katie [name], 69 y.o. female, has a history of pain and functional disability in the right knee due to arthritis and has failed non-surgical conservative treatments for greater than 12 weeks to includeNSAID's and/or analgesics, corticosteriod injections and viscosupplementation injections.  Onset of symptoms was gradual, starting 5 years ago with rapidlly worsening course since that time. The patient noted prior procedures on the knee to include  arthroscopy and menisectomy on the right knee(s).  Patient currently rates pain in the right knee(s) at 4 out of 10 with activity. Patient has night pain, worsening of pain with activity and weight bearing, pain that interferes with activities of daily living and crepitus.  Patient has evidence of subchondral sclerosis and joint space narrowing by imaging studies. There is no active infection.  There are no active problems to display for this patient.  Past Medical History  Diagnosis Date  . Restless leg syndrome     No past surgical history on file.   (Not in a hospital admission) Allergies  Allergen Reactions  . Anesthetics, Amide   . Other     Pain Medications "knocks her out"    Social History  Substance Use Topics  . Smoking status: Never Smoker   . Smokeless tobacco: Not on file  . Alcohol Use: No    No family history on file.   Review of Systems  Constitutional: Negative.   HENT: Negative.   Eyes: Negative.   Respiratory: Negative.   Cardiovascular: Negative.   Gastrointestinal: Negative.   Genitourinary: Negative.   Musculoskeletal: Positive for joint pain.  Skin: Negative.   Neurological: Negative.   Endo/Heme/Allergies: Negative.   Psychiatric/Behavioral: Negative.     Objective:  Physical Exam  Constitutional: She is oriented to person, place, and time. She appears  well-developed and well-nourished.  HENT:  Head: Normocephalic and atraumatic.  Eyes: EOM are normal. Pupils are equal, round, and reactive to light.  Neck: Normal range of motion. Neck supple.  Cardiovascular: Normal rate and regular rhythm.   Respiratory: Effort normal and breath sounds normal.  GI: Soft. Bowel sounds are normal.  Musculoskeletal:  Antalgic gait on the right where she goes into increasing valgus with weight bearing.  Negative straight leg raise, both sides.  Negative log roll, both hips.  Symptomatic right knee has increased valgus, more than 12 degrees, which is correctable.  Tibiofemoral and patellofemoral crepitus.  Stable ligaments.  I can still get full get full extension, about 110 degrees of flexion.  Neurovascularly intact distally.    Opposite left knee has better alignment.  Good stability.  No joint line findings.  Some patellofemoral crepitus, not extreme.     Neurological: She is alert and oriented to person, place, and time.  Skin: Skin is warm and dry.  Psychiatric: She has a normal mood and affect. Her behavior is normal. Judgment and thought content normal.    Vital signs in last 24 hours: @VSRANGES @  Labs:   Estimated body mass index is 24.22 kg/(m^2) as calculated from the following:   Height as of 12/29/13: 5\' 6"  (1.676 m).   Weight as of 12/29/13: 68.04 kg (150 lb).   Imaging Review Plain radiographs demonstrate severe degenerative joint disease of the right knee(s). The overall alignment ismild valgus. The bone quality appears to be fair for age and reported activity level.  Assessment/Plan:  End stage arthritis,  right knee   The patient history, physical examination, clinical judgment of the provider and imaging studies are consistent with end stage degenerative joint disease of the right knee(s) and total knee arthroplasty is deemed medically necessary. The treatment options including medical management, injection therapy arthroscopy and  arthroplasty were discussed at length. The risks and benefits of total knee arthroplasty were presented and reviewed. The risks due to aseptic loosening, infection, stiffness, patella tracking problems, thromboembolic complications and other imponderables were discussed. The patient acknowledged the explanation, agreed to proceed with the plan and consent was signed. Patient is being admitted for inpatient treatment for surgery, pain control, PT, OT, prophylactic antibiotics, VTE prophylaxis, progressive ambulation and ADL's and discharge planning. The patient is planning to be discharged home with home health services

## 2015-07-23 ENCOUNTER — Encounter (HOSPITAL_COMMUNITY): Payer: Self-pay

## 2015-07-23 ENCOUNTER — Encounter (HOSPITAL_COMMUNITY)
Admission: RE | Admit: 2015-07-23 | Discharge: 2015-07-23 | Disposition: A | Discharge status: Home / Self Care | Payer: Medicare Other | Source: Ambulatory Visit | Attending: Orthopedic Surgery | Admitting: Orthopedic Surgery

## 2015-07-23 ENCOUNTER — Other Ambulatory Visit: Payer: Self-pay

## 2015-07-23 DIAGNOSIS — Z01818 Encounter for other preprocedural examination: Secondary | ICD-10-CM | POA: Diagnosis not present

## 2015-07-23 DIAGNOSIS — Z0183 Encounter for blood typing: Secondary | ICD-10-CM | POA: Insufficient documentation

## 2015-07-23 DIAGNOSIS — M1711 Unilateral primary osteoarthritis, right knee: Secondary | ICD-10-CM | POA: Insufficient documentation

## 2015-07-23 DIAGNOSIS — Z01812 Encounter for preprocedural laboratory examination: Secondary | ICD-10-CM | POA: Diagnosis not present

## 2015-07-23 HISTORY — DX: Other specified postprocedural states: R11.2

## 2015-07-23 HISTORY — DX: Unspecified osteoarthritis, unspecified site: M19.90

## 2015-07-23 HISTORY — DX: Other specified postprocedural states: Z98.890

## 2015-07-23 LAB — SURGICAL PCR SCREEN
MRSA, PCR: NEGATIVE
Staphylococcus aureus: NEGATIVE

## 2015-07-23 LAB — COMPREHENSIVE METABOLIC PANEL
ALT: 24 U/L (ref 14–54)
ANION GAP: 7 (ref 5–15)
AST: 23 U/L (ref 15–41)
Albumin: 4.4 g/dL (ref 3.5–5.0)
Alkaline Phosphatase: 59 U/L (ref 38–126)
BILIRUBIN TOTAL: 0.8 mg/dL (ref 0.3–1.2)
BUN: 15 mg/dL (ref 6–20)
CHLORIDE: 105 mmol/L (ref 101–111)
CO2: 27 mmol/L (ref 22–32)
Calcium: 9.7 mg/dL (ref 8.9–10.3)
Creatinine, Ser: 0.66 mg/dL (ref 0.44–1.00)
Glucose, Bld: 92 mg/dL (ref 65–99)
POTASSIUM: 3.9 mmol/L (ref 3.5–5.1)
Sodium: 139 mmol/L (ref 135–145)
TOTAL PROTEIN: 7.1 g/dL (ref 6.5–8.1)

## 2015-07-23 LAB — TYPE AND SCREEN
ABO/RH(D): B POS
ANTIBODY SCREEN: NEGATIVE

## 2015-07-23 LAB — APTT: APTT: 32 s (ref 24–37)

## 2015-07-23 LAB — CBC WITH DIFFERENTIAL/PLATELET
BASOS ABS: 0 10*3/uL (ref 0.0–0.1)
Basophils Relative: 1 %
EOS PCT: 1 %
Eosinophils Absolute: 0.1 10*3/uL (ref 0.0–0.7)
HCT: 44.6 % (ref 36.0–46.0)
Hemoglobin: 14.8 g/dL (ref 12.0–15.0)
LYMPHS ABS: 2.2 10*3/uL (ref 0.7–4.0)
LYMPHS PCT: 34 %
MCH: 27.3 pg (ref 26.0–34.0)
MCHC: 33.2 g/dL (ref 30.0–36.0)
MCV: 82.1 fL (ref 78.0–100.0)
MONO ABS: 0.5 10*3/uL (ref 0.1–1.0)
MONOS PCT: 8 %
Neutro Abs: 3.7 10*3/uL (ref 1.7–7.7)
Neutrophils Relative %: 56 %
PLATELETS: 308 10*3/uL (ref 150–400)
RBC: 5.43 MIL/uL — ABNORMAL HIGH (ref 3.87–5.11)
RDW: 14.4 % (ref 11.5–15.5)
WBC: 6.5 10*3/uL (ref 4.0–10.5)

## 2015-07-23 LAB — PROTIME-INR
INR: 1.02 (ref 0.00–1.49)
PROTHROMBIN TIME: 13.6 s (ref 11.6–15.2)

## 2015-07-23 LAB — ABO/RH: ABO/RH(D): B POS

## 2015-07-23 NOTE — Pre-Procedure Instructions (Signed)
    ARISTA BARKO  07/23/2015      [id number] [address], [place] [address] [place] [address] Phone: [phone] Fax: [phone]    Your procedure is scheduled on Wed. Dec. 14  Report to [facility] Admitting at 1:15 A.M.  Call this number if you have problems the morning of surgery:  [phone]              Any questions prior to surgery call [phone] Monday-Friday 8am-4pm   Remember:  Do not eat food or drink liquids after midnight Tuesday, Dec. 13   Take these medicines the morning of surgery with A SIP OF WATER :none              Stop fish oil, vitamins, NSAIDS: advil, ibuprofen, motrin, aleve 1 week prior to surgery   Do not wear jewelry, make-up or nail polish.  Do not wear lotions, powders, or perfumes.  You may  not wear deodorant.  Do not shave 48 hours prior to surgery.  Men may shave face and neck.  Do not bring valuables to the hospital.  [facility] is not responsible for any belongings or valuables.  Contacts, dentures or bridgework may not be worn into surgery.  Leave your suitcase in the car.  After surgery it may be brought to your room.  For patients admitted to the hospital, discharge time will be determined by your treatment team.  Patients discharged the day of surgery will not be allowed to drive home.    Special instructions: review preparing for surgery  Please read over the following fact sheets that you were given. Pain Booklet, Coughing and Deep Breathing, Blood Transfusion Information, Total Joint Packet, MRSA Information and Surgical Site Infection Prevention

## 2015-07-23 NOTE — Progress Notes (Signed)
PCP:Dr. Ozella Rocks at [facility]

## 2015-07-24 LAB — URINE CULTURE

## 2015-08-03 MED ORDER — SODIUM CHLORIDE 0.9 % IV SOLN
1000.0000 mg | INTRAVENOUS | Status: AC
  Administered 2015-08-04: 1000 mg via INTRAVENOUS
  Filled 2015-08-03: qty 10

## 2015-08-04 ENCOUNTER — Inpatient Hospital Stay (HOSPITAL_COMMUNITY): Payer: Medicare Other | Admitting: Anesthesiology

## 2015-08-04 ENCOUNTER — Inpatient Hospital Stay (HOSPITAL_COMMUNITY)
Admission: AD | Admit: 2015-08-04 | Discharge: 2015-08-06 | DRG: 470 | Disposition: A | Discharge status: Home Health | Payer: Medicare Other | Source: Ambulatory Visit | Attending: Orthopedic Surgery | Admitting: Orthopedic Surgery

## 2015-08-04 ENCOUNTER — Encounter (HOSPITAL_COMMUNITY)
Admission: AD | Disposition: A | Discharge status: Home Health | Payer: Self-pay | Source: Ambulatory Visit | Attending: Orthopedic Surgery

## 2015-08-04 ENCOUNTER — Encounter (HOSPITAL_COMMUNITY): Payer: Self-pay | Admitting: *Deleted

## 2015-08-04 ENCOUNTER — Inpatient Hospital Stay (HOSPITAL_COMMUNITY): Payer: Medicare Other

## 2015-08-04 DIAGNOSIS — Z884 Allergy status to anesthetic agent status: Secondary | ICD-10-CM

## 2015-08-04 DIAGNOSIS — Z471 Aftercare following joint replacement surgery: Secondary | ICD-10-CM | POA: Diagnosis not present

## 2015-08-04 DIAGNOSIS — Z96659 Presence of unspecified artificial knee joint: Secondary | ICD-10-CM

## 2015-08-04 DIAGNOSIS — D62 Acute posthemorrhagic anemia: Secondary | ICD-10-CM | POA: Diagnosis not present

## 2015-08-04 DIAGNOSIS — M179 Osteoarthritis of knee, unspecified: Secondary | ICD-10-CM | POA: Diagnosis present

## 2015-08-04 DIAGNOSIS — M85861 Other specified disorders of bone density and structure, right lower leg: Secondary | ICD-10-CM | POA: Diagnosis present

## 2015-08-04 DIAGNOSIS — G2581 Restless legs syndrome: Secondary | ICD-10-CM | POA: Diagnosis present

## 2015-08-04 DIAGNOSIS — M1711 Unilateral primary osteoarthritis, right knee: Secondary | ICD-10-CM | POA: Diagnosis not present

## 2015-08-04 DIAGNOSIS — M238X1 Other internal derangements of right knee: Secondary | ICD-10-CM | POA: Diagnosis present

## 2015-08-04 DIAGNOSIS — Z96651 Presence of right artificial knee joint: Secondary | ICD-10-CM | POA: Diagnosis not present

## 2015-08-04 DIAGNOSIS — M1710 Unilateral primary osteoarthritis, unspecified knee: Secondary | ICD-10-CM | POA: Diagnosis present

## 2015-08-04 DIAGNOSIS — Z886 Allergy status to analgesic agent status: Secondary | ICD-10-CM

## 2015-08-04 HISTORY — PX: TOTAL KNEE ARTHROPLASTY: SHX125

## 2015-08-04 SURGERY — ARTHROPLASTY, KNEE, TOTAL
Anesthesia: General | Site: Knee | Laterality: Right

## 2015-08-04 MED ORDER — ONDANSETRON HCL 4 MG PO TABS: 4.0000 mg | ORAL_TABLET | Freq: Three times a day (TID) | ORAL | Status: DC | PRN

## 2015-08-04 MED ORDER — MENTHOL 3 MG MT LOZG: 1.0000 | LOZENGE | OROMUCOSAL | Status: DC | PRN

## 2015-08-04 MED ORDER — PRAMIPEXOLE DIHYDROCHLORIDE 0.125 MG PO TABS: 0.5000 mg | ORAL_TABLET | Freq: Every day | ORAL | Status: DC

## 2015-08-04 MED ORDER — OXYCODONE HCL 5 MG PO TABS
5.0000 mg | ORAL_TABLET | ORAL | Status: DC | PRN
  Administered 2015-08-04 – 2015-08-06 (×11): 10 mg via ORAL
  Filled 2015-08-04 (×11): qty 2

## 2015-08-04 MED ORDER — SODIUM CHLORIDE 0.9 % IJ SOLN
INTRAMUSCULAR | Status: AC
  Filled 2015-08-04: qty 20

## 2015-08-04 MED ORDER — ALUM & MAG HYDROXIDE-SIMETH 200-200-20 MG/5ML PO SUSP: 30.0000 mL | ORAL | Status: DC | PRN

## 2015-08-04 MED ORDER — BISACODYL 5 MG PO TBEC: 5.0000 mg | DELAYED_RELEASE_TABLET | Freq: Every day | ORAL | Status: DC | PRN

## 2015-08-04 MED ORDER — DOCUSATE SODIUM 100 MG PO CAPS
100.0000 mg | ORAL_CAPSULE | Freq: Two times a day (BID) | ORAL | Status: DC
Start: 2015-08-04 — End: 2015-08-06
  Administered 2015-08-04 – 2015-08-06 (×4): 100 mg via ORAL
  Filled 2015-08-04 (×4): qty 1

## 2015-08-04 MED ORDER — BISACODYL 10 MG RE SUPP: 10.0000 mg | Freq: Every day | RECTAL | Status: DC | PRN

## 2015-08-04 MED ORDER — PHENOL 1.4 % MT LIQD: 1.0000 | OROMUCOSAL | Status: DC | PRN

## 2015-08-04 MED ORDER — METOCLOPRAMIDE HCL 5 MG PO TABS: 5.0000 mg | ORAL_TABLET | Freq: Three times a day (TID) | ORAL | Status: DC | PRN

## 2015-08-04 MED ORDER — APIXABAN 2.5 MG PO TABS
2.5000 mg | ORAL_TABLET | Freq: Two times a day (BID) | ORAL | Status: DC
  Administered 2015-08-05 – 2015-08-06 (×3): 2.5 mg via ORAL
  Filled 2015-08-04 (×3): qty 1

## 2015-08-04 MED ORDER — OXYCODONE-ACETAMINOPHEN 5-325 MG PO TABS: 1.0000 | ORAL_TABLET | ORAL | Status: DC | PRN

## 2015-08-04 MED ORDER — POTASSIUM CHLORIDE IN NACL 20-0.9 MEQ/L-% IV SOLN
INTRAVENOUS | Status: DC
  Administered 2015-08-04: 18:00:00 via INTRAVENOUS
  Filled 2015-08-04: qty 1000

## 2015-08-04 MED ORDER — ONDANSETRON HCL 4 MG PO TABS
4.0000 mg | ORAL_TABLET | Freq: Four times a day (QID) | ORAL | Status: DC | PRN
  Administered 2015-08-04 – 2015-08-06 (×3): 4 mg via ORAL
  Filled 2015-08-04 (×3): qty 1

## 2015-08-04 MED ORDER — MIDAZOLAM HCL 2 MG/2ML IJ SOLN
INTRAMUSCULAR | Status: AC
  Filled 2015-08-04: qty 2

## 2015-08-04 MED ORDER — BUPIVACAINE HCL (PF) 0.5 % IJ SOLN
INTRAMUSCULAR | Status: AC
  Filled 2015-08-04: qty 30

## 2015-08-04 MED ORDER — CHLORHEXIDINE GLUCONATE 4 % EX LIQD: 60.0000 mL | Freq: Once | CUTANEOUS | Status: DC

## 2015-08-04 MED ORDER — DEXMEDETOMIDINE HCL 200 MCG/2ML IV SOLN
INTRAVENOUS | Status: DC | PRN
  Administered 2015-08-04: 8 ug via INTRAVENOUS

## 2015-08-04 MED ORDER — ACETAMINOPHEN 325 MG PO TABS
650.0000 mg | ORAL_TABLET | Freq: Four times a day (QID) | ORAL | Status: DC | PRN
  Administered 2015-08-04: 650 mg via ORAL
  Filled 2015-08-04: qty 2

## 2015-08-04 MED ORDER — PRAMIPEXOLE DIHYDROCHLORIDE 0.125 MG PO TABS
0.5000 mg | ORAL_TABLET | Freq: Every day | ORAL | Status: DC
  Administered 2015-08-04 – 2015-08-05 (×2): 0.5 mg via ORAL
  Filled 2015-08-04 (×2): qty 4

## 2015-08-04 MED ORDER — HYDROMORPHONE HCL 1 MG/ML IJ SOLN
INTRAMUSCULAR | Status: AC
  Filled 2015-08-04: qty 1

## 2015-08-04 MED ORDER — FENTANYL CITRATE (PF) 250 MCG/5ML IJ SOLN
INTRAMUSCULAR | Status: AC
  Filled 2015-08-04: qty 5

## 2015-08-04 MED ORDER — HYDROMORPHONE HCL 1 MG/ML IJ SOLN
0.5000 mg | INTRAMUSCULAR | Status: DC | PRN
  Administered 2015-08-04 – 2015-08-05 (×4): 1 mg via INTRAVENOUS
  Filled 2015-08-04 (×4): qty 1

## 2015-08-04 MED ORDER — DIPHENHYDRAMINE HCL 50 MG/ML IJ SOLN
INTRAMUSCULAR | Status: DC | PRN
  Administered 2015-08-04: 25 mg via INTRAVENOUS

## 2015-08-04 MED ORDER — METHOCARBAMOL 500 MG PO TABS: 500.0000 mg | ORAL_TABLET | Freq: Four times a day (QID) | ORAL | Status: DC

## 2015-08-04 MED ORDER — MAGNESIUM CITRATE PO SOLN: 1.0000 | Freq: Once | ORAL | Status: DC | PRN

## 2015-08-04 MED ORDER — CEFAZOLIN SODIUM-DEXTROSE 2-3 GM-% IV SOLR
2.0000 g | INTRAVENOUS | Status: AC
  Administered 2015-08-04: 2 g via INTRAVENOUS
  Filled 2015-08-04: qty 50

## 2015-08-04 MED ORDER — METHOCARBAMOL 1000 MG/10ML IJ SOLN
500.0000 mg | Freq: Four times a day (QID) | INTRAVENOUS | Status: DC | PRN
  Filled 2015-08-04: qty 5

## 2015-08-04 MED ORDER — PROPOFOL 10 MG/ML IV BOLUS
INTRAVENOUS | Status: DC | PRN
  Administered 2015-08-04: 20 mg via INTRAVENOUS

## 2015-08-04 MED ORDER — GLYCOPYRROLATE 0.2 MG/ML IJ SOLN
INTRAMUSCULAR | Status: AC
  Filled 2015-08-04: qty 1

## 2015-08-04 MED ORDER — LIDOCAINE HCL (CARDIAC) 20 MG/ML IV SOLN
INTRAVENOUS | Status: AC
  Filled 2015-08-04: qty 10

## 2015-08-04 MED ORDER — LACTATED RINGERS IV SOLN
INTRAVENOUS | Status: DC
  Administered 2015-08-04 (×2): via INTRAVENOUS

## 2015-08-04 MED ORDER — EPHEDRINE SULFATE 50 MG/ML IJ SOLN
INTRAMUSCULAR | Status: AC
  Filled 2015-08-04: qty 2

## 2015-08-04 MED ORDER — FENTANYL CITRATE (PF) 100 MCG/2ML IJ SOLN
INTRAMUSCULAR | Status: DC | PRN
  Administered 2015-08-04: 50 ug via INTRAVENOUS

## 2015-08-04 MED ORDER — POLYETHYLENE GLYCOL 3350 17 G PO PACK: 17.0000 g | PACK | Freq: Every day | ORAL | Status: DC | PRN

## 2015-08-04 MED ORDER — DIPHENHYDRAMINE HCL 12.5 MG/5ML PO ELIX: 12.5000 mg | ORAL_SOLUTION | ORAL | Status: DC | PRN

## 2015-08-04 MED ORDER — PROPOFOL 10 MG/ML IV BOLUS
INTRAVENOUS | Status: AC
  Filled 2015-08-04: qty 20

## 2015-08-04 MED ORDER — PHENYLEPHRINE 40 MCG/ML (10ML) SYRINGE FOR IV PUSH (FOR BLOOD PRESSURE SUPPORT)
PREFILLED_SYRINGE | INTRAVENOUS | Status: AC
  Filled 2015-08-04: qty 10

## 2015-08-04 MED ORDER — CELECOXIB 200 MG PO CAPS
200.0000 mg | ORAL_CAPSULE | Freq: Two times a day (BID) | ORAL | Status: DC
  Administered 2015-08-04 – 2015-08-06 (×4): 200 mg via ORAL
  Filled 2015-08-04 (×4): qty 1

## 2015-08-04 MED ORDER — BUPIVACAINE HCL 0.5 % IJ SOLN
INTRAMUSCULAR | Status: DC | PRN
  Administered 2015-08-04: 10 mL

## 2015-08-04 MED ORDER — METOCLOPRAMIDE HCL 5 MG/ML IJ SOLN
5.0000 mg | Freq: Three times a day (TID) | INTRAMUSCULAR | Status: DC | PRN
  Administered 2015-08-04 – 2015-08-05 (×2): 10 mg via INTRAVENOUS
  Filled 2015-08-04 (×2): qty 2

## 2015-08-04 MED ORDER — ZOLPIDEM TARTRATE 5 MG PO TABS: 5.0000 mg | ORAL_TABLET | Freq: Every evening | ORAL | Status: DC | PRN

## 2015-08-04 MED ORDER — LIDOCAINE HCL (CARDIAC) 20 MG/ML IV SOLN
INTRAVENOUS | Status: DC | PRN
  Administered 2015-08-04: 50 mg via INTRAVENOUS

## 2015-08-04 MED ORDER — APIXABAN 2.5 MG PO TABS: ORAL_TABLET | ORAL | Status: DC

## 2015-08-04 MED ORDER — ONDANSETRON HCL 4 MG/2ML IJ SOLN: 4.0000 mg | Freq: Four times a day (QID) | INTRAMUSCULAR | Status: DC | PRN

## 2015-08-04 MED ORDER — METHOCARBAMOL 500 MG PO TABS
500.0000 mg | ORAL_TABLET | Freq: Four times a day (QID) | ORAL | Status: DC | PRN
  Administered 2015-08-04 – 2015-08-05 (×5): 500 mg via ORAL
  Filled 2015-08-04 (×4): qty 1

## 2015-08-04 MED ORDER — BUPIVACAINE LIPOSOME 1.3 % IJ SUSP
INTRAMUSCULAR | Status: DC | PRN
  Administered 2015-08-04: 20 mL

## 2015-08-04 MED ORDER — DEXAMETHASONE SODIUM PHOSPHATE 10 MG/ML IJ SOLN
10.0000 mg | Freq: Once | INTRAMUSCULAR | Status: AC
  Administered 2015-08-05: 10 mg via INTRAVENOUS
  Filled 2015-08-04: qty 1

## 2015-08-04 MED ORDER — SODIUM CHLORIDE 0.9 % IR SOLN
Status: DC | PRN
Start: 2015-08-04 — End: 2015-08-04
  Administered 2015-08-04: 3000 mL
  Administered 2015-08-04: 1000 mL

## 2015-08-04 MED ORDER — MIDAZOLAM HCL 5 MG/5ML IJ SOLN
INTRAMUSCULAR | Status: DC | PRN
  Administered 2015-08-04: 2 mg via INTRAVENOUS

## 2015-08-04 MED ORDER — PROPOFOL 500 MG/50ML IV EMUL
INTRAVENOUS | Status: DC | PRN
  Administered 2015-08-04: 50 ug/kg/min via INTRAVENOUS

## 2015-08-04 MED ORDER — ROCURONIUM BROMIDE 50 MG/5ML IV SOLN
INTRAVENOUS | Status: AC
  Filled 2015-08-04: qty 1

## 2015-08-04 MED ORDER — METHOCARBAMOL 500 MG PO TABS
ORAL_TABLET | ORAL | Status: AC
  Filled 2015-08-04: qty 1

## 2015-08-04 MED ORDER — ACETAMINOPHEN 650 MG RE SUPP: 650.0000 mg | Freq: Four times a day (QID) | RECTAL | Status: DC | PRN

## 2015-08-04 MED ORDER — SODIUM CHLORIDE 0.9 % IJ SOLN
INTRAMUSCULAR | Status: DC | PRN
  Administered 2015-08-04: 40 mL via INTRAVENOUS

## 2015-08-04 MED ORDER — OXYCODONE HCL 5 MG PO TABS
ORAL_TABLET | ORAL | Status: AC
  Administered 2015-08-04: 10 mg
  Filled 2015-08-04: qty 2

## 2015-08-04 MED ORDER — BUPIVACAINE LIPOSOME 1.3 % IJ SUSP
20.0000 mL | INTRAMUSCULAR | Status: DC
Start: 2015-08-04 — End: 2015-08-04
  Filled 2015-08-04: qty 20

## 2015-08-04 MED ORDER — CEFAZOLIN SODIUM 1-5 GM-% IV SOLN
1.0000 g | Freq: Four times a day (QID) | INTRAVENOUS | Status: AC
  Administered 2015-08-04 – 2015-08-05 (×2): 1 g via INTRAVENOUS
  Filled 2015-08-04 (×3): qty 50

## 2015-08-04 SURGICAL SUPPLY — 75 items
APL SKNCLS STERI-STRIP NONHPOA (GAUZE/BANDAGES/DRESSINGS) ×1
BANDAGE ELASTIC 4 VELCRO ST LF (GAUZE/BANDAGES/DRESSINGS) ×3 IMPLANT
BANDAGE ELASTIC 6 VELCRO ST LF (GAUZE/BANDAGES/DRESSINGS) ×3 IMPLANT
BANDAGE ESMARK 6X9 LF (GAUZE/BANDAGES/DRESSINGS) ×1 IMPLANT
BENZOIN TINCTURE PRP APPL 2/3 (GAUZE/BANDAGES/DRESSINGS) ×3 IMPLANT
BLADE SAG 18X100X1.27 (BLADE) ×6 IMPLANT
BNDG CMPR 9X6 STRL LF SNTH (GAUZE/BANDAGES/DRESSINGS) ×1
BNDG ESMARK 6X9 LF (GAUZE/BANDAGES/DRESSINGS) ×3
BOWL SMART MIX CTS (DISPOSABLE) ×3 IMPLANT
CAPT KNEE TOTAL 3 ×2 IMPLANT
CEMENT BONE SIMPLEX SPEEDSET (Cement) ×6 IMPLANT
CLOSURE STERI-STRIP 1/2X4 (GAUZE/BANDAGES/DRESSINGS) ×1
CLOSURE WOUND 1/2 X4 (GAUZE/BANDAGES/DRESSINGS) ×2
CLSR STERI-STRIP ANTIMIC 1/2X4 (GAUZE/BANDAGES/DRESSINGS) ×1 IMPLANT
COVER SURGICAL LIGHT HANDLE (MISCELLANEOUS) ×3 IMPLANT
CUFF TOURNIQUET SINGLE 34IN LL (TOURNIQUET CUFF) ×3 IMPLANT
DRAPE EXTREMITY T 121X128X90 (DRAPE) ×3 IMPLANT
DRAPE IMP U-DRAPE 54X76 (DRAPES) ×3 IMPLANT
DRAPE PROXIMA HALF (DRAPES) ×3 IMPLANT
DRAPE U-SHAPE 47X51 STRL (DRAPES) ×3 IMPLANT
DRSG PAD ABDOMINAL 8X10 ST (GAUZE/BANDAGES/DRESSINGS) ×3 IMPLANT
DURAPREP 26ML APPLICATOR (WOUND CARE) ×6 IMPLANT
ELECT CAUTERY BLADE 6.4 (BLADE) ×3 IMPLANT
ELECT REM PT RETURN 9FT ADLT (ELECTROSURGICAL) ×3
ELECTRODE REM PT RTRN 9FT ADLT (ELECTROSURGICAL) ×1 IMPLANT
EVACUATOR 1/8 PVC DRAIN (DRAIN) ×3 IMPLANT
FACESHIELD WRAPAROUND (MASK) ×6 IMPLANT
FACESHIELD WRAPAROUND OR TEAM (MASK) ×2 IMPLANT
GAUZE SPONGE 4X4 12PLY STRL (GAUZE/BANDAGES/DRESSINGS) ×3 IMPLANT
GLOVE BIOGEL PI IND STRL 6.5 (GLOVE) IMPLANT
GLOVE BIOGEL PI IND STRL 7.0 (GLOVE) ×1 IMPLANT
GLOVE BIOGEL PI INDICATOR 6.5 (GLOVE) ×2
GLOVE BIOGEL PI INDICATOR 7.0 (GLOVE) ×2
GLOVE ECLIPSE 6.5 STRL STRAW (GLOVE) ×2 IMPLANT
GLOVE ORTHO TXT STRL SZ7.5 (GLOVE) ×3 IMPLANT
GLOVE SURG ORTHO 7.0 STRL STRW (GLOVE) ×3 IMPLANT
GLOVE SURG SS PI 7.0 STRL IVOR (GLOVE) ×2 IMPLANT
GOWN STRL REUS W/ TWL LRG LVL3 (GOWN DISPOSABLE) ×2 IMPLANT
GOWN STRL REUS W/ TWL XL LVL3 (GOWN DISPOSABLE) ×1 IMPLANT
GOWN STRL REUS W/TWL LRG LVL3 (GOWN DISPOSABLE) ×6
GOWN STRL REUS W/TWL XL LVL3 (GOWN DISPOSABLE) ×3
HANDPIECE INTERPULSE COAX TIP (DISPOSABLE) ×3
IMMOBILIZER KNEE 22 UNIV (SOFTGOODS) ×3 IMPLANT
IMMOBILIZER KNEE 24 THIGH 36 (MISCELLANEOUS) IMPLANT
IMMOBILIZER KNEE 24 UNIV (MISCELLANEOUS)
KIT BASIN OR (CUSTOM PROCEDURE TRAY) ×3 IMPLANT
KIT ROOM TURNOVER OR (KITS) ×3 IMPLANT
MANIFOLD NEPTUNE II (INSTRUMENTS) ×3 IMPLANT
NDL 18GX1X1/2 (RX/OR ONLY) (NEEDLE) ×1 IMPLANT
NDL HYPO 25GX1X1/2 BEV (NEEDLE) ×1 IMPLANT
NEEDLE 18GX1X1/2 (RX/OR ONLY) (NEEDLE) ×3 IMPLANT
NEEDLE HYPO 25GX1X1/2 BEV (NEEDLE) ×3 IMPLANT
NS IRRIG 1000ML POUR BTL (IV SOLUTION) ×3 IMPLANT
PACK TOTAL JOINT (CUSTOM PROCEDURE TRAY) ×3 IMPLANT
PACK UNIVERSAL I (CUSTOM PROCEDURE TRAY) ×3 IMPLANT
PAD ARMBOARD 7.5X6 YLW CONV (MISCELLANEOUS) ×6 IMPLANT
PAD CAST 4YDX4 CTTN HI CHSV (CAST SUPPLIES) ×1 IMPLANT
PADDING CAST COTTON 4X4 STRL (CAST SUPPLIES) ×3
SET HNDPC FAN SPRY TIP SCT (DISPOSABLE) ×1 IMPLANT
SPONGE GAUZE 4X4 12PLY STER LF (GAUZE/BANDAGES/DRESSINGS) ×2 IMPLANT
STRIP CLOSURE SKIN 1/2X4 (GAUZE/BANDAGES/DRESSINGS) ×4 IMPLANT
SUCTION FRAZIER TIP 10 FR DISP (SUCTIONS) ×3 IMPLANT
SUT MNCRL AB 4-0 PS2 18 (SUTURE) ×3 IMPLANT
SUT VIC AB 0 CT1 27 (SUTURE)
SUT VIC AB 0 CT1 27XBRD ANBCTR (SUTURE) IMPLANT
SUT VIC AB 1 CTX 36 (SUTURE) ×3
SUT VIC AB 1 CTX36XBRD ANBCTR (SUTURE) ×1 IMPLANT
SUT VIC AB 2-0 CT1 27 (SUTURE) ×6
SUT VIC AB 2-0 CT1 TAPERPNT 27 (SUTURE) ×2 IMPLANT
SYR 50ML LL SCALE MARK (SYRINGE) ×3 IMPLANT
SYR CONTROL 10ML LL (SYRINGE) ×3 IMPLANT
TOWEL OR 17X24 6PK STRL BLUE (TOWEL DISPOSABLE) ×3 IMPLANT
TOWEL OR 17X26 10 PK STRL BLUE (TOWEL DISPOSABLE) ×3 IMPLANT
TRAY CATH 16FR W/PLASTIC CATH (SET/KITS/TRAYS/PACK) ×2 IMPLANT
WATER STERILE IRR 1000ML POUR (IV SOLUTION) ×3 IMPLANT

## 2015-08-04 NOTE — Transfer of Care (Signed)
Immediate Anesthesia Transfer of Care Note  Patient: Katie [name]  Procedure(s) Performed: Procedure(s): TOTAL RIGHT KNEE ARTHROPLASTY (Right)  Patient Location: PACU  Anesthesia Type:Spinal  Level of Consciousness: awake, alert  and oriented  Airway & Oxygen Therapy: Patient Spontanous Breathing and Patient connected to nasal cannula oxygen  Post-op Assessment: Report given to RN and Post -op Vital signs reviewed and stable  Post vital signs: Reviewed and stable  Last Vitals:  Filed Vitals:   08/04/15 1128  BP: 139/69  Pulse: 85  Temp: 36.8 C  Resp: 20    Complications: No apparent anesthesia complications

## 2015-08-04 NOTE — Progress Notes (Addendum)
Orthopedic Tech Progress Note Patient Details:  Katie [name] May 24, 1946 [id number]  CPM Right Knee CPM Right Knee: On Right Knee Flexion (Degrees): 90 Right Knee Extension (Degrees): 0  Ortho Devices Ortho Device/Splint Location: applied ohf to bed,  footsie roll Ortho Device/Splint Interventions: Ordered, Application   Braulio Bosch 08/04/2015, 3:47 PM

## 2015-08-04 NOTE — Interval H&P Note (Signed)
History and Physical Interval Note:  08/04/2015 7:51 AM  Katie [name]  has presented today for surgery, with the diagnosis of DJD RIGHT KNEE  The various methods of treatment have been discussed with the patient and family. After consideration of risks, benefits and other options for treatment, the patient has consented to  Procedure(s): TOTAL RIGHT KNEE ARTHROPLASTY (Right) as a surgical intervention .  The patient's history has been reviewed, patient examined, no change in status, stable for surgery.  I have reviewed the patient's chart and labs.  Questions were answered to the patient's satisfaction.     Ninetta Lights

## 2015-08-04 NOTE — H&P (View-Only) (Signed)
TOTAL KNEE ADMISSION H&P  Patient is being admitted for right total knee arthroplasty.  Subjective:  Chief Complaint:right knee pain.  HPI: Katie [name], 69 y.o. female, has a history of pain and functional disability in the right knee due to arthritis and has failed non-surgical conservative treatments for greater than 12 weeks to includeNSAID's and/or analgesics, corticosteriod injections and viscosupplementation injections.  Onset of symptoms was gradual, starting 5 years ago with rapidlly worsening course since that time. The patient noted prior procedures on the knee to include  arthroscopy and menisectomy on the right knee(s).  Patient currently rates pain in the right knee(s) at 4 out of 10 with activity. Patient has night pain, worsening of pain with activity and weight bearing, pain that interferes with activities of daily living and crepitus.  Patient has evidence of subchondral sclerosis and joint space narrowing by imaging studies. There is no active infection.  There are no active problems to display for this patient.  Past Medical History  Diagnosis Date  . Restless leg syndrome     No past surgical history on file.   (Not in a hospital admission) Allergies  Allergen Reactions  . Anesthetics, Amide   . Other     Pain Medications "knocks her out"    Social History  Substance Use Topics  . Smoking status: Never Smoker   . Smokeless tobacco: Not on file  . Alcohol Use: No    No family history on file.   Review of Systems  Constitutional: Negative.   HENT: Negative.   Eyes: Negative.   Respiratory: Negative.   Cardiovascular: Negative.   Gastrointestinal: Negative.   Genitourinary: Negative.   Musculoskeletal: Positive for joint pain.  Skin: Negative.   Neurological: Negative.   Endo/Heme/Allergies: Negative.   Psychiatric/Behavioral: Negative.     Objective:  Physical Exam  Constitutional: She is oriented to person, place, and time. She appears  well-developed and well-nourished.  HENT:  Head: Normocephalic and atraumatic.  Eyes: EOM are normal. Pupils are equal, round, and reactive to light.  Neck: Normal range of motion. Neck supple.  Cardiovascular: Normal rate and regular rhythm.   Respiratory: Effort normal and breath sounds normal.  GI: Soft. Bowel sounds are normal.  Musculoskeletal:  Antalgic gait on the right where she goes into increasing valgus with weight bearing.  Negative straight leg raise, both sides.  Negative log roll, both hips.  Symptomatic right knee has increased valgus, more than 12 degrees, which is correctable.  Tibiofemoral and patellofemoral crepitus.  Stable ligaments.  I can still get full get full extension, about 110 degrees of flexion.  Neurovascularly intact distally.    Opposite left knee has better alignment.  Good stability.  No joint line findings.  Some patellofemoral crepitus, not extreme.     Neurological: She is alert and oriented to person, place, and time.  Skin: Skin is warm and dry.  Psychiatric: She has a normal mood and affect. Her behavior is normal. Judgment and thought content normal.    Vital signs in last 24 hours: @VSRANGES @  Labs:   Estimated body mass index is 24.22 kg/(m^2) as calculated from the following:   Height as of 12/29/13: 5\' 6"  (1.676 m).   Weight as of 12/29/13: 68.04 kg (150 lb).   Imaging Review Plain radiographs demonstrate severe degenerative joint disease of the right knee(s). The overall alignment ismild valgus. The bone quality appears to be fair for age and reported activity level.  Assessment/Plan:  End stage arthritis,  right knee   The patient history, physical examination, clinical judgment of the provider and imaging studies are consistent with end stage degenerative joint disease of the right knee(s) and total knee arthroplasty is deemed medically necessary. The treatment options including medical management, injection therapy arthroscopy and  arthroplasty were discussed at length. The risks and benefits of total knee arthroplasty were presented and reviewed. The risks due to aseptic loosening, infection, stiffness, patella tracking problems, thromboembolic complications and other imponderables were discussed. The patient acknowledged the explanation, agreed to proceed with the plan and consent was signed. Patient is being admitted for inpatient treatment for surgery, pain control, PT, OT, prophylactic antibiotics, VTE prophylaxis, progressive ambulation and ADL's and discharge planning. The patient is planning to be discharged home with home health services

## 2015-08-04 NOTE — Discharge Summary (Addendum)
Patient ID: Katie [name] MRN: [id number] DOB/AGE: [birth year] 69 y.o.  Admit date: 08/04/2015 Discharge date: 08/06/2015  Admission Diagnoses:  Active Problems:   DJD (degenerative joint disease) of knee   Discharge Diagnoses:  Same  Past Medical History  Diagnosis Date  . Restless leg syndrome   . PONV (postoperative nausea and vomiting)     hard to wake up  . Arthritis     Surgeries: Procedure(s): TOTAL RIGHT KNEE ARTHROPLASTY on 08/04/2015   Consultants:    Discharged Condition: Improved  Hospital Course: Katie [name] is an 69 y.o. female who was admitted 08/04/2015 for operative treatment of primary localized osteoarthritis right knee. Patient has severe unremitting pain that affects sleep, daily activities, and work/hobbies. After pre-op clearance the patient was taken to the operating room on 08/04/2015 and underwent  Procedure(s): TOTAL RIGHT KNEE ARTHROPLASTY.  Patient with a pre-op Hb of 14.8 developed ABLA on pod #1 with a Hb of 11.9. She is currently stable but we will continue to follow.  Patient was given perioperative antibiotics:      Anti-infectives    Start     Dose/Rate Route Frequency Ordered Stop   08/04/15 1800  ceFAZolin (ANCEF) IVPB 1 g/50 mL premix     1 g 100 mL/hr over 30 Minutes Intravenous Every 6 hours 08/04/15 1400 08/05/15 0125   08/04/15 1245  ceFAZolin (ANCEF) IVPB 2 g/50 mL premix     2 g 100 mL/hr over 30 Minutes Intravenous On call to O.R. 08/04/15 1120 08/04/15 1212       Patient was given sequential compression devices, early ambulation, and chemoprophylaxis to prevent DVT.  Patient benefited maximally from hospital stay and there were no complications.    Recent vital signs:  Patient Vitals for the past 24 hrs:  BP Temp Temp src Pulse Resp SpO2  08/05/15 2300 (!) 139/59 mmHg 97.9 F (36.6 C) Oral 90 17 98 %  08/05/15 1245 (!) 141/52 mmHg 98.2 F (36.8 C) - 86 18 98 %     Recent laboratory studies:   Recent  Labs  08/05/15 0555 08/06/15 0520  WBC 9.0 12.2*  HGB 11.9* 12.0  HCT 36.3 35.9*  PLT 255 276  NA 136 138  K 3.9 3.9  CL 105 106  CO2 25 24  BUN 7 9  CREATININE 0.62 0.64  GLUCOSE 141* 122*  CALCIUM 8.4* 9.0     Discharge Medications:     Medication List    STOP taking these medications        fish oil-omega-3 fatty acids 1000 MG capsule      TAKE these medications        apixaban 2.5 MG Tabs tablet  Commonly known as:  ELIQUIS  Take 1 tab po q12 hours x 14 days following surgery to prevent blood clots     bisacodyl 5 MG EC tablet  Commonly known as:  DULCOLAX  Take 1 tablet (5 mg total) by mouth daily as needed for moderate constipation.     CALCIUM 600 PO  Take 1 tablet by mouth daily.     methocarbamol 500 MG tablet  Commonly known as:  ROBAXIN  Take 1 tablet (500 mg total) by mouth 4 (four) times daily.     multivitamin with minerals Tabs tablet  Take 1 tablet by mouth daily.     ondansetron 4 MG tablet  Commonly known as:  ZOFRAN  Take 1 tablet (4 mg total) by mouth every 8 (eight) hours  as needed for nausea or vomiting.     oxyCODONE-acetaminophen 5-325 MG tablet  Commonly known as:  ROXICET  Take 1-2 tablets by mouth every 4 (four) hours as needed.     pramipexole 0.5 MG tablet  Commonly known as:  MIRAPEX  Take 0.5 mg by mouth at bedtime.     VITAMIN D PO  Take 1 tablet by mouth daily.        Diagnostic Studies: Dg Knee Right Port  08/04/2015  CLINICAL DATA:  Post right total knee replacement. EXAM: PORTABLE RIGHT KNEE - 1-2 VIEW COMPARISON:  None. FINDINGS: Examination demonstrates evidence of patient's recent right knee arthroplasty with prosthetic components intact and normally located. Air and fluid are present within the knee joint with air in the adjacent soft tissues compatible with recent surgery. Remainder of the exam is within normal. IMPRESSION: Expected postoperative changes post right knee arthroplasty. Electronically Signed    By: Marin Olp M.D.   On: 08/04/2015 15:36    Disposition: 01-Home or Self Care    Follow-up Information    Follow up with Ninetta Lights, MD. Schedule an appointment as soon as possible for a visit in 2 weeks.   Specialty:  Orthopedic Surgery   Contact information:   [place] [phone]       Follow up with [facility]   Why:  They will contact you to schedule home therapy visits.   Contact information:   [place] [address] [phone]        Signed: Fannie Knee 08/06/2015, 7:16 AM

## 2015-08-04 NOTE — Progress Notes (Signed)
Utilization review completed.  

## 2015-08-05 ENCOUNTER — Encounter (HOSPITAL_COMMUNITY): Payer: Self-pay | Admitting: Orthopedic Surgery

## 2015-08-05 LAB — BASIC METABOLIC PANEL
Anion gap: 6 (ref 5–15)
BUN: 7 mg/dL (ref 6–20)
CALCIUM: 8.4 mg/dL — AB (ref 8.9–10.3)
CO2: 25 mmol/L (ref 22–32)
CREATININE: 0.62 mg/dL (ref 0.44–1.00)
Chloride: 105 mmol/L (ref 101–111)
GFR calc Af Amer: >60 mL/min (ref >60)
GFR calc non Af Amer: >60 mL/min (ref >60)
GLUCOSE: 141 mg/dL — AB (ref 65–99)
Potassium: 3.9 mmol/L (ref 3.5–5.1)
Sodium: 136 mmol/L (ref 135–145)

## 2015-08-05 LAB — CBC
HEMATOCRIT: 36.3 % (ref 36.0–46.0)
Hemoglobin: 11.9 g/dL — ABNORMAL LOW (ref 12.0–15.0)
MCH: 27.2 pg (ref 26.0–34.0)
MCHC: 32.8 g/dL (ref 30.0–36.0)
MCV: 83.1 fL (ref 78.0–100.0)
Platelets: 255 10*3/uL (ref 150–400)
RBC: 4.37 MIL/uL (ref 3.87–5.11)
RDW: 14.3 % (ref 11.5–15.5)
WBC: 9 10*3/uL (ref 4.0–10.5)

## 2015-08-05 MED ORDER — BUPIVACAINE IN DEXTROSE 0.75-8.25 % IT SOLN
INTRATHECAL | Status: DC | PRN
  Administered 2015-08-04: 1.6 mL via INTRATHECAL

## 2015-08-05 NOTE — Progress Notes (Signed)
Orthopedic Tech Progress Note Patient Details:  Katie [name] 1945-10-11 [id number] Off cpm at 1915 Patient ID: [name], female   DOB: 1945-12-04, 69 y.o.   MRN: [id number]   Braulio Bosch 08/05/2015, 7:16 PM

## 2015-08-05 NOTE — Op Note (Signed)
NAMEKATHELEEN, HAMSON             ACCOUNT NO.:  [id number]  MEDICAL RECORD NO.:  [id number]  LOCATION:  [id number]                        FACILITY:  [facility]  PHYSICIAN:  Ninetta Lights, M.D. DATE OF BIRTH:  11/06/1945  DATE OF PROCEDURE:  08/04/2015 DATE OF DISCHARGE:                              OPERATIVE REPORT   PREOPERATIVE DIAGNOSIS:  Right knee end-stage arthritis, most marked medial compartment, primary generalized.  POSTOPERATIVE DIAGNOSES:  Right knee end-stage arthritis, most marked medial compartment, primary generalized.  Some regional osteopenia.  PROCEDURE:  Modified minimally invasive right total knee replacement with Stryker triathlon prosthesis.  Cemented pegged cruciate-retaining #4 femoral component.  Cemented #4 tibial component and 9-mm CS insert. Cemented resurfacing 32-mm patellar component.  SURGEON:  Ninetta Lights, M.D.  ASSISTANT:  Elmyra Ricks, PA, present throughout the entire case and necessary for timely completion of the procedure.  ANESTHESIA:  Spinal.  BLOOD LOSS:  Minimal.  SPECIMENS:  None.  CULTURES:  None.  COMPLICATIONS:  None.  DRESSINGS:  Soft compressive knee immobilizer.  TOURNIQUET TIME:  50 minutes.  DESCRIPTION OF PROCEDURE:  The patient was brought to the operating room and placed on the operating table in supine position.  After adequate anesthesia had been obtained, tourniquet applied, prepped and draped in usual sterile fashion.  Exsanguinated with elevation of Esmarch. Tourniquet inflated to 350 mmHg.  Straight incision above the patella, down to tibial tubercle.  Medial arthrotomy and vastus splitting. Distal femur exposed.  An 8-mm resection and flexible intramedullary guide.  Using epicondylar axis, the femur was sized, cut and fitted for pegged cruciate-retaining #4 component.  Proximal tibial resection with extramedullary guide.  Size #4 component as well.  Patella exposed. Posterior 10-mm removed.  Drilled,  sized, and fitted for a 32-mm component.  Trial was put in place.  A 9-mm CS insert.  With this construct, nicely balanced knee, good stability, full motion.  Good patellar tracking.  Tibia was marked for rotation and hand reamed.  All trials had been removed.  Copious irrigation with a pulse irrigating device.  Cement prepared, placed on all components, firmly seated. Polyethylene attached to tibia, knee reduced.  Patella held with clamp. Once the cement hardened, the knee was irrigated again.  Soft tissue was injected with Exparel.  Arthrotomy was closed with #1 Vicryl. Subcutaneous and subcuticular closure.  Margins were injected with Marcaine.  Sterile compressive dressing applied.  Tourniquet was deflated and removed.  Knee immobilizer applied.  Anesthesia reversed. Brought to the recovery room.  Tolerated the surgery well.  No complications.     Ninetta Lights, M.D.     DFM/MEDQ  D:  08/04/2015  T:  08/05/2015  Job:  [id number]

## 2015-08-05 NOTE — Discharge Instructions (Signed)
INSTRUCTIONS AFTER JOINT REPLACEMENT   o Remove items at home which could result in a fall. This includes throw rugs or furniture in walking pathways o ICE to the affected joint every three hours while awake for 30 minutes at a time, for at least the first 3-5 days, and then as needed for pain and swelling.  Continue to use ice for pain and swelling. You may notice swelling that will progress down to the foot and ankle.  This is normal after surgery.  Elevate your leg when you are not up walking on it.   o Continue to use the breathing machine you got in the hospital (incentive spirometer) which will help keep your temperature down.  It is common for your temperature to cycle up and down following surgery, especially at night when you are not up moving around and exerting yourself.  The breathing machine keeps your lungs expanded and your temperature down.  BLOOD THINNER: TAKE ELIQUIS AS PRESCRIBED FOR A TOTAL OF 14 DAYS FOLLOWING SURGERY TO PREVENT BLOOD CLOTS.  ONCE FINISHED WITH THIS, TAKE ASPIRIN 325 MG ONE TAB ONCE DAILY FOR THE NEXT 14 DAYS.  THIS IS ALSO USED TO PREVENT BLOOD CLOTS.   DIET:  As you were doing prior to hospitalization, we recommend a well-balanced diet.  DRESSING / WOUND CARE / SHOWERING  You may change your dressing 3-5 days after surgery.  Then change the dressing every day with sterile gauze.  Please use good hand washing techniques before changing the dressing.  Do not use any lotions or creams on the incision until instructed by your surgeon. and You may shower 3 days after surgery, but keep the wounds dry during showering.  You may use an occlusive plastic wrap (Press'n Seal for example), NO SOAKING/SUBMERGING IN THE BATHTUB.  If the bandage gets wet, change with a clean dry gauze.  If the incision gets wet, pat the wound dry with a clean towel.  ACTIVITY  o Increase activity slowly as tolerated, but follow the weight bearing instructions below.   o No driving for 6  weeks or until further direction given by your physician.  You cannot drive while taking narcotics.  o No lifting or carrying greater than 10 lbs. until further directed by your surgeon. o Avoid periods of inactivity such as sitting longer than an hour when not asleep. This helps prevent blood clots.  o You may return to work once you are authorized by your doctor.     WEIGHT BEARING   Weight bearing as tolerated with assist device (walker, cane, etc) as directed, use it as long as suggested by your surgeon or therapist, typically at least 4-6 weeks.   EXERCISES  Results after joint replacement surgery are often greatly improved when you follow the exercise, range of motion and muscle strengthening exercises prescribed by your doctor. Safety measures are also important to protect the joint from further injury. Any time any of these exercises cause you to have increased pain or swelling, decrease what you are doing until you are comfortable again and then slowly increase them. If you have problems or questions, call your caregiver or physical therapist for advice.   Rehabilitation is important following a joint replacement. After just a few days of immobilization, the muscles of the leg can become weakened and shrink (atrophy).  These exercises are designed to build up the tone and strength of the thigh and leg muscles and to improve motion. Often times heat used for twenty to  thirty minutes before working out will loosen up your tissues and help with improving the range of motion but do not use heat for the first two weeks following surgery (sometimes heat can increase post-operative swelling).   These exercises can be done on a training (exercise) mat, on the floor, on a table or on a bed. Use whatever works the best and is most comfortable for you.    Use music or television while you are exercising so that the exercises are a pleasant break in your day. This will make your life better with the  exercises acting as a break in your routine that you can look forward to.   Perform all exercises about fifteen times, three times per day or as directed.  You should exercise both the operative leg and the other leg as well.  Exercises include:    Quad Sets - Tighten up the muscle on the front of the thigh (Quad) and hold for 5-10 seconds.    Straight Leg Raises - With your knee straight (if you were given a brace, keep it on), lift the leg to 60 degrees, hold for 3 seconds, and slowly lower the leg.  Perform this exercise against resistance later as your leg gets stronger.   Leg Slides: Lying on your back, slowly slide your foot toward your buttocks, bending your knee up off the floor (only go as far as is comfortable). Then slowly slide your foot back down until your leg is flat on the floor again.   Angel Wings: Lying on your back spread your legs to the side as far apart as you can without causing discomfort.   Hamstring Strength:  Lying on your back, push your heel against the floor with your leg straight by tightening up the muscles of your buttocks.  Repeat, but this time bend your knee to a comfortable angle, and push your heel against the floor.  You may put a pillow under the heel to make it more comfortable if necessary.   A rehabilitation program following joint replacement surgery can speed recovery and prevent re-injury in the future due to weakened muscles. Contact your doctor or a physical therapist for more information on knee rehabilitation.    CONSTIPATION  Constipation is defined medically as fewer than three stools per week and severe constipation as less than one stool per week.  Even if you have a regular bowel pattern at home, your normal regimen is likely to be disrupted due to multiple reasons following surgery.  Combination of anesthesia, postoperative narcotics, change in appetite and fluid intake all can affect your bowels.   YOU MUST use at least one of the  following options; they are listed in order of increasing strength to get the job done.  They are all available over the counter, and you may need to use some, POSSIBLY even all of these options:    Drink plenty of fluids (prune juice may be helpful) and high fiber foods Colace 100 mg by mouth twice a day  Senokot for constipation as directed and as needed Dulcolax (bisacodyl), take with full glass of water  Miralax (polyethylene glycol) once or twice a day as needed.  If you have tried all these things and are unable to have a bowel movement in the first 3-4 days after surgery call either your surgeon or your primary doctor.    If you experience loose stools or diarrhea, hold the medications until you stool forms back up.  If your  symptoms do not get better within 1 week or if they get worse, check with your doctor.  If you experience "the worst abdominal pain ever" or develop nausea or vomiting, please contact the office immediately for further recommendations for treatment.   ITCHING:  If you experience itching with your medications, try taking only a single pain pill, or even half a pain pill at a time.  You can also use Benadryl over the counter for itching or also to help with sleep.   TED HOSE STOCKINGS:  Use stockings on both legs until for at least 2 weeks or as directed by physician office. They may be removed at night for sleeping.  MEDICATIONS:  See your medication summary on the After Visit Summary that nursing will review with you.  You may have some home medications which will be placed on hold until you complete the course of blood thinner medication.  It is important for you to complete the blood thinner medication as prescribed.  PRECAUTIONS:  If you experience chest pain or shortness of breath - call 911 immediately for transfer to the hospital emergency department.   If you develop a fever greater that 101 F, purulent drainage from wound, increased redness or drainage from  wound, foul odor from the wound/dressing, or calf pain - CONTACT YOUR SURGEON.                                                   FOLLOW-UP APPOINTMENTS:  If you do not already have a post-op appointment, please call the office for an appointment to be seen by your surgeon.  Guidelines for how soon to be seen are listed in your After Visit Summary, but are typically between 1-4 weeks after surgery.  OTHER INSTRUCTIONS:   Knee Replacement:  Do not place pillow under knee, focus on keeping the knee straight while resting. CPM instructions: 0-90 degrees, 2 hours in the morning, 2 hours in the afternoon, and 2 hours in the evening. Place foam block, curve side up under heel at all times except when in CPM or when walking.  DO NOT modify, tear, cut, or change the foam block in any way.  MAKE SURE YOU:   Understand these instructions.   Get help right away if you are not doing well or get worse.    Thank you for letting us be a part of your medical care team.  It is a privilege we respect greatly.  We hope these instructions will help you stay on track for a fast and full recovery!   Information on my medicine - ELIQUIS (apixaban)  This medication education was reviewed with me or my healthcare representative as part of my discharge preparation.  The pharmacist that spoke with me during my hospital stay was:  Duayne Cal, [facility]  Why was Eliquis prescribed for you? Eliquis was prescribed for you to reduce the risk of blood clots forming after orthopedic surgery.    What do You need to know about Eliquis? Take your Eliquis TWICE DAILY - one tablet in the morning and one tablet in the evening with or without food.  It would be best to take the dose about the same time each day.  If you have difficulty swallowing the tablet whole please discuss with your pharmacist how to take the medication safely.  Take  Eliquis exactly as prescribed by your doctor and DO NOT stop taking Eliquis  without talking to the doctor who prescribed the medication.  Stopping without other medication to take the place of Eliquis may increase your risk of developing a clot.  After discharge, you should have regular check-up appointments with your healthcare provider that is prescribing your Eliquis.  What do you do if you miss a dose? If a dose of ELIQUIS is not taken at the scheduled time, take it as soon as possible on the same day and twice-daily administration should be resumed.  The dose should not be doubled to make up for a missed dose.  Do not take more than one tablet of ELIQUIS at the same time.  Important Safety Information A possible side effect of Eliquis is bleeding. You should call your healthcare provider right away if you experience any of the following: ? Bleeding from an injury or your nose that does not stop. ? Unusual colored urine (red or dark brown) or unusual colored stools (red or black). ? Unusual bruising for unknown reasons. ? A serious fall or if you hit your head (even if there is no bleeding).  Some medicines may interact with Eliquis and might increase your risk of bleeding or clotting while on Eliquis. To help avoid this, consult your healthcare provider or pharmacist prior to using any new prescription or non-prescription medications, including herbals, vitamins, non-steroidal anti-inflammatory drugs (NSAIDs) and supplements.  This website has more information on Eliquis (apixaban): http://www.eliquis.com/eliquis/home

## 2015-08-05 NOTE — Progress Notes (Signed)
Physical Therapy Treatment Patient Details Name: Katie [name] MRN: [id number] DOB: Dec 28, 1945 Today's Date: 08/05/2015    History of Present Illness Pt admitted for R TKA with only PMHx of restless leg syndrome    PT Comments    Pt continues to progress R LE strength, ROM, and ambulation endurance. She appeared much more comfortable with ambulation and showed improved gait sequencing but still lacks reciprocal pattern. She is on track to meet her goals upon D/C. Will continue to follow to increase independence with ambulation and R LE ROM/strength.   Follow Up Recommendations  Home health PT     Equipment Recommendations  Rolling walker with 5" wheels;3in1 (PT)    Recommendations for Other Services       Precautions / Restrictions Precautions Precautions: Knee Restrictions Weight Bearing Restrictions: Yes RLE Weight Bearing: Weight bearing as tolerated    Mobility  Bed Mobility               General bed mobility comments: In chair upon arrival  Transfers Overall transfer level: Needs assistance   Transfers: Sit to/from Stand Sit to Stand: Min guard         General transfer comment: Min guard for stability, cues to WB on R LE when standing. Increased time.   Ambulation/Gait Ambulation/Gait assistance: Min guard Ambulation Distance (Feet): 180 Feet Assistive device: Rolling walker (2 wheeled) Gait Pattern/deviations: Step-to pattern   Gait velocity interpretation: Below normal speed for age/gender General Gait Details: cues for heel strike, increased WB on R LE, toe off during gait, and knee flexion during initial swing phase. Min guard for stability   Stairs            Wheelchair Mobility    Modified Rankin (Stroke Patients Only)       Balance Overall balance assessment: Needs assistance Sitting-balance support: Feet supported Sitting balance-Leahy Scale: Good     Standing balance support: No upper extremity supported Standing  balance-Leahy Scale: Fair Standing balance comment: use of RW when walking, not needed when static                    Cognition Arousal/Alertness: Awake/alert Behavior During Therapy: WFL for tasks assessed/performed Overall Cognitive Status: Within Functional Limits for tasks assessed                      Exercises Total Joint Exercises Quad Sets: AROM;Right;10 reps;Supine Heel Slides: AROM;Right;5 reps;Supine Straight Leg Raises: AROM;Right;10 reps;Supine Long Arc Quad: AAROM;Right;10 reps;Seated Goniometric ROM: 8-73 Marching in Standing: AROM;Both;15 reps;Seated    General Comments        Pertinent Vitals/Pain Pain Assessment: 0-10 Pain Score: 4  Pain Location: R knee with mobility, no pain when resting Pain Descriptors / Indicators: Aching;Sore Pain Intervention(s): Monitored during session;Repositioned    Home Living                      Prior Function            PT Goals (current goals can now be found in the care plan section) Progress towards PT goals: Progressing toward goals    Frequency  7X/week    PT Plan      Co-evaluation             End of Session Equipment Utilized During Treatment: Gait belt Activity Tolerance: Patient tolerated treatment well Patient left: in chair;with call bell/phone within reach     Time: 1253-1310 PT Time Calculation (  min) (ACUTE ONLY): 17 min  Charges:  $Gait Training: 8-22 mins                    G CodesHaynes Bast Aug 12, 2015, 2:41 PM Haynes Bast, SPT August 12, 2015 2:41 PM

## 2015-08-05 NOTE — Anesthesia Procedure Notes (Signed)
Spinal Patient location during procedure: OR Start time: 08/05/2015 12:05 PM End time: 08/05/2015 12:16 PM Staffing Anesthesiologist: Aron Needles Performed by: anesthesiologist  Preanesthetic Checklist Completed: patient identified, site marked, surgical consent, pre-op evaluation, timeout performed, IV checked, risks and benefits discussed and monitors and equipment checked Spinal Block Patient position: sitting Prep: Betadine Patient monitoring: heart rate, cardiac monitor, continuous pulse ox and blood pressure Approach: midline Location: L3-4 Injection technique: single-shot Needle Needle type: Pencan  Needle gauge: 25 G Needle length: 9 cm Assessment Sensory level: T6 Additional Notes Pt tolerated the procedure well.

## 2015-08-05 NOTE — Progress Notes (Signed)
Subjective: 1 Day Post-Op Procedure(s) (LRB): TOTAL RIGHT KNEE ARTHROPLASTY (Right) Patient reports pain as moderate.  Patient reports lightheadedness when standing.  Occasional nausea but resolved with zofran.  No vomiting.  No chest pain/sob.  Negative flatus/bm.   Objective: Vital signs in last 24 hours: Temp:  [97.7 F (36.5 C)-98.5 F (36.9 C)] 98.3 F (36.8 C) (12/15 0611) Pulse Rate:  [72-97] 72 (12/15 0611) Resp:  [11-21] 18 (12/15 0611) BP: (106-139)/(69-91) 131/72 mmHg (12/15 0611) SpO2:  [95 %-100 %] 99 % (12/15 0611) Weight:  [75.864 kg (167 lb 4 oz)] 75.864 kg (167 lb 4 oz) (12/14 1128)  Intake/Output from previous day: 12/14 0701 - 12/15 0700 In: 1500 [I.V.:1500] Out: 350 [Urine:350] Intake/Output this shift:    No results for input(s): HGB in the last 72 hours. No results for input(s): WBC, RBC, HCT, PLT in the last 72 hours. No results for input(s): NA, K, CL, CO2, BUN, CREATININE, GLUCOSE, CALCIUM in the last 72 hours. No results for input(s): LABPT, INR in the last 72 hours.  Neurologically intact Neurovascular intact Sensation intact distally Intact pulses distally Dorsiflexion/Plantar flexion intact Compartment soft  Assessment/Plan: 1 Day Post-Op Procedure(s) (LRB): TOTAL RIGHT KNEE ARTHROPLASTY (Right) Advance diet Up with therapy D/C IV fluids Plan for discharge tomorrow with hhpt WBAT RLE Dry dressing change prn  Fannie Knee 08/05/2015, 6:18 AM

## 2015-08-05 NOTE — Evaluation (Signed)
Occupational Therapy Evaluation Patient Details Name: Katie [name] MRN: [id number] DOB: [birth year] Today's Date: 08/05/2015    History of Present Illness Pt admitted for R TKA with only PMHx of restless leg syndrome   Clinical Impression   Patient presenting with decreased I in self care, balance, functional mobility/transfers, and acute pain. Patient reports I  PTA. Patient currently functioning set up - max A . Patient will benefit from acute OT to increase overall independence in the areas of ADLs, functional mobility, and safety in order to safely discharge home.    Follow Up Recommendations  No OT follow up;Supervision/Assistance - 24 hour    Equipment Recommendations  3 in 1 bedside comode;Tub/shower bench    Recommendations for Other Services       Precautions / Restrictions Precautions Precautions: Knee Restrictions Weight Bearing Restrictions: Yes RLE Weight Bearing: Weight bearing as tolerated      Mobility Bed Mobility Overal bed mobility: Modified Independent                Transfers Overall transfer level: Needs assistance Equipment used: Rolling walker (2 wheeled) Transfers: Sit to/from [employer] Transfers Sit to Stand: Min guard Stand pivot transfers: Min guard       General transfer comment: cues for hand placement and proper technique    Balance Overall balance assessment: Needs assistance Sitting-balance support: Feet supported;No upper extremity supported Sitting balance-Leahy Scale: Good     Standing balance support: During functional activity;No upper extremity supported Standing balance-Leahy Scale: Fair Standing balance comment: use of RW            ADL Overall ADL's : Needs assistance/impaired     Grooming: Wash/dry face;Wash/dry hands;Standing;Min guard   Upper Body Bathing: Set up;Sitting   Lower Body Bathing: Moderate assistance;Sit to/from stand   Upper Body Dressing : Set up;Sitting   Lower Body  Dressing: Maximal assistance;Sit to/from stand   Toilet Transfer: Min guard;Comfort height toilet;RW;Ambulation;Stand-pivot   Toileting- Water quality scientist and Hygiene: Min guard;Sit to/from stand         General ADL Comments: Upon entering the room, pt seated in recliner chair with c/o nausea. Pt recently receieved medication for nausea and pain prior to entering the room. Pt agreeable to OT intervention. Pt performing sit <>stand with min guard. Pt moves very fast once up and requiring min guard for safety with imporved heel strike. Pt ambulated to toilet for min guard transfer onto elevated toielt seat. Steady assist needed during clothing management and hygiene for safety with balance.                Pertinent Vitals/Pain Pain Assessment: 0-10 Pain Score: 5  Pain Location: R knee with mobility Pain Descriptors / Indicators: Aching;Sore Pain Intervention(s): Limited activity within patient's tolerance;Monitored during session;Premedicated before session;Repositioned     Hand Dominance Right   Extremity/Trunk Assessment Upper Extremity Assessment Upper Extremity Assessment: Overall WFL for tasks assessed   Lower Extremity Assessment Lower Extremity Assessment: RLE deficits/detail RLE Deficits / Details: decreased strength and ROM as expected post op   Cervical / Trunk Assessment Cervical / Trunk Assessment: Normal   Communication Communication Communication: No difficulties   Cognition Arousal/Alertness: Awake/alert Behavior During Therapy: WFL for tasks assessed/performed Overall Cognitive Status: Within Functional Limits for tasks assessed                                Home Living Family/patient expects to be discharged  to:: Private residence Living Arrangements: Spouse/significant other Available Help at Discharge: Family;Available 24 hours/day Type of Home: House Home Access: Stairs to enter [employer] of Steps: 1   Home Layout:  Two level;Able to live on main level with bedroom/bathroom   Alternate Level Stairs-Rails: None Bathroom Shower/Tub: Tub/shower unit;Door Shower/tub characteristics: Charity fundraiser: Standard Bathroom Accessibility: Yes How Accessible: Accessible via walker Home Equipment: None          Prior Functioning/Environment Level of Independence: Independent             OT Diagnosis: Acute pain   OT Problem List: Decreased activity tolerance;Impaired balance (sitting and/or standing);Decreased safety awareness;Pain;Decreased knowledge of precautions;Increased edema;Decreased knowledge of use of DME or AE;Decreased strength;Decreased range of motion   OT Treatment/Interventions: Self-care/ADL training;Therapeutic exercise;Energy conservation;DME and/or AE instruction;Balance training;Therapeutic activities;Patient/family education    OT Goals(Current goals can be found in the care plan section) Acute Rehab OT Goals Patient Stated Goal: to go home OT Goal Formulation: With patient Time For Goal Achievement: 08/05/15 Potential to Achieve Goals: Good ADL Goals Pt Will Perform Grooming: with modified independence;sitting Pt Will Perform Upper Body Bathing: with modified independence;sitting Pt Will Perform Lower Body Bathing: with modified independence;with adaptive equipment;sit to/from stand Pt Will Perform Upper Body Dressing: with modified independence;sitting Pt Will Perform Lower Body Dressing: with modified independence;with adaptive equipment;sit to/from stand Pt Will Transfer to Toilet: with modified independence;bedside commode;ambulating;squat pivot transfer Pt Will Perform Toileting - Clothing Manipulation and hygiene: with modified independence;sit to/from stand Pt Will Perform Tub/Shower Transfer: Tub transfer;Stand pivot transfer;tub bench;rolling walker;ambulating;with supervision  OT Frequency: Min 2X/week   Barriers to D/C:    none known at this time           End of Session Equipment Utilized During Treatment: Rolling walker CPM Right Knee CPM Right Knee: On Right Knee Flexion (Degrees): 60 Right Knee Extension (Degrees): 0  Activity Tolerance: Patient tolerated treatment well Patient left: in bed;with call bell/phone within reach   Time: 0836-0901 OT Time Calculation (min): 25 min Charges:  OT General Charges $OT Visit: 1 Procedure OT Evaluation $Initial OT Evaluation Tier I: 1 Procedure OT Treatments $Self Care/Home Management : 8-22 mins G-Codes:    Phineas Semen, MS, OTR/L 08/05/2015, 9:27 AM

## 2015-08-05 NOTE — Anesthesia Preprocedure Evaluation (Addendum)
Anesthesia Evaluation  Patient identified by MRN, date of birth, ID band Patient awake    Reviewed: Allergy & Precautions, NPO status , Patient's Chart, lab work & pertinent test results  History of Anesthesia Complications (+) PONV  Airway Mallampati: II   Neck ROM: full    Dental   Pulmonary neg pulmonary ROS,    breath sounds clear to auscultation       Cardiovascular negative cardio ROS   Rhythm:regular Rate:Normal     Neuro/Psych    GI/Hepatic   Endo/Other    Renal/GU      Musculoskeletal  (+) Arthritis ,   Abdominal   Peds  Hematology   Anesthesia Other Findings   Reproductive/Obstetrics                             Anesthesia Physical Anesthesia Plan  ASA: II  Anesthesia Plan: MAC and Spinal   Post-op Pain Management:    Induction: Intravenous  Airway Management Planned: Simple Face Mask  Additional Equipment:   Intra-op Plan:   Post-operative Plan:   Informed Consent: I have reviewed the patients History and Physical, chart, labs and discussed the procedure including the risks, benefits and alternatives for the proposed anesthesia with the patient or authorized representative who has indicated his/her understanding and acceptance.     Plan Discussed with: CRNA, Anesthesiologist and Surgeon  Anesthesia Plan Comments:         Anesthesia Quick Evaluation

## 2015-08-05 NOTE — Evaluation (Signed)
Physical Therapy Evaluation Patient Details Name: Katie [name] MRN: [id number] DOB: 13-Nov-1945 Today's Date: 08/05/2015   History of Present Illness  Pt admitted for R TKA with only PMHx of restless leg syndrome  Clinical Impression  Pt moving well for initial session with decreased weight bearing and heel strike on RLE. Pt educated for precautions, bone foam, CPM use, progression and pt with plans to be on cane by Christmas. Pt with decreased strength, ROM, mobility and gait who will benefit from acute therapy to maximize mobility, function, gait and safety to decrease burden of care.     Follow Up Recommendations Home health PT    Equipment Recommendations  Rolling walker with 5" wheels;3in1 (PT)    Recommendations for Other Services OT consult     Precautions / Restrictions Precautions Precautions: Knee Restrictions RLE Weight Bearing: Weight bearing as tolerated      Mobility  Bed Mobility Overal bed mobility: Modified Independent                Transfers Overall transfer level: Needs assistance   Transfers: Sit to/from Stand Sit to Stand: Min guard         General transfer comment: cues for hand placement, sequence and leg positioning  Ambulation/Gait Ambulation/Gait assistance: Min guard Ambulation Distance (Feet): 75 Feet Assistive device: Rolling walker (2 wheeled) Gait Pattern/deviations: Step-to pattern;Decreased stride length   Gait velocity interpretation: Below normal speed for age/gender General Gait Details: cues for RW use, sequence, heel strike and increased weight bearing on L  Stairs            Wheelchair Mobility    Modified Rankin (Stroke Patients Only)       Balance                                             Pertinent Vitals/Pain Pain Assessment: 0-10 Pain Score: 5  Pain Location: right knee in standing Pain Descriptors / Indicators: Aching;Sore Pain Intervention(s): Limited activity within  patient's tolerance;Monitored during session;Premedicated before session;Repositioned;Ice applied    Home Living Family/patient expects to be discharged to:: Private residence Living Arrangements: Spouse/significant other Available Help at Discharge: Family;Available 24 hours/day Type of Home: House Home Access: Stairs to enter   [employer] of Steps: 1 Home Layout: Two level;Able to live on main level with bedroom/bathroom Home Equipment: None      Prior Function Level of Independence: Independent               Hand Dominance        Extremity/Trunk Assessment   Upper Extremity Assessment: Overall WFL for tasks assessed           Lower Extremity Assessment: RLE deficits/detail RLE Deficits / Details: decreased strength and ROm as expected post op    Cervical / Trunk Assessment: Normal  Communication   Communication: No difficulties  Cognition Arousal/Alertness: Awake/alert Behavior During Therapy: WFL for tasks assessed/performed Overall Cognitive Status: Within Functional Limits for tasks assessed                      General Comments      Exercises Total Joint Exercises Quad Sets: AROM;Right;10 reps;Supine Heel Slides: AROM;Right;5 reps;Supine Straight Leg Raises: AROM;Right;10 reps;Supine      Assessment/Plan    PT Assessment Patient needs continued PT services  PT Diagnosis Difficulty walking;Acute pain  PT Problem List Decreased strength;Decreased activity tolerance;Decreased balance;Decreased knowledge of use of DME;Decreased mobility;Pain;Decreased knowledge of precautions;Decreased range of motion  PT Treatment Interventions DME instruction;Gait training;Stair training;Therapeutic activities;Therapeutic exercise;Functional mobility training;Patient/family education   PT Goals (Current goals can be found in the Care Plan section) Acute Rehab PT Goals Patient Stated Goal: be on a cane in [place] for Christmas PT Goal  Formulation: With patient Time For Goal Achievement: 08/12/15 Potential to Achieve Goals: Good    Frequency 7X/week   Barriers to discharge        Co-evaluation               End of Session Equipment Utilized During Treatment: Gait belt Activity Tolerance: Patient tolerated treatment well Patient left: in chair;with call bell/phone within reach Nurse Communication: Mobility status;Weight bearing status         Time: 0718-0753 PT Time Calculation (min) (ACUTE ONLY): 35 min   Charges:   PT Evaluation $Initial PT Evaluation Tier I: 1 Procedure PT Treatments $Gait Training: 8-22 mins   PT G CodesMelford Aase 08/05/2015, 7:54 AM Elwyn Reach, [place]

## 2015-08-05 NOTE — Anesthesia Postprocedure Evaluation (Signed)
Anesthesia Post Note  Patient: Katie [name]  Procedure(s) Performed: Procedure(s) (LRB): TOTAL RIGHT KNEE ARTHROPLASTY (Right)  Patient location during evaluation: PACU Anesthesia Type: Spinal Level of consciousness: awake and alert and oriented Pain management: pain level controlled Vital Signs Assessment: post-procedure vital signs reviewed and stable Respiratory status: spontaneous breathing, nonlabored ventilation and respiratory function stable Cardiovascular status: stable Postop Assessment: spinal receding Anesthetic complications: no    Last Vitals:  Filed Vitals:   08/05/15 0236 08/05/15 0611  BP: 128/82 131/72  Pulse: 86 72  Temp: 36.9 C 36.8 C  Resp: 18 18    Last Pain:  Filed Vitals:   08/05/15 0814  PainSc: [place]

## 2015-08-06 LAB — BASIC METABOLIC PANEL
Anion gap: 8 (ref 5–15)
BUN: 9 mg/dL (ref 6–20)
CO2: 24 mmol/L (ref 22–32)
Calcium: 9 mg/dL (ref 8.9–10.3)
Chloride: 106 mmol/L (ref 101–111)
Creatinine, Ser: 0.64 mg/dL (ref 0.44–1.00)
GFR calc Af Amer: >60 mL/min (ref >60)
GLUCOSE: 122 mg/dL — AB (ref 65–99)
POTASSIUM: 3.9 mmol/L (ref 3.5–5.1)
Sodium: 138 mmol/L (ref 135–145)

## 2015-08-06 LAB — CBC
HEMATOCRIT: 35.9 % — AB (ref 36.0–46.0)
Hemoglobin: 12 g/dL (ref 12.0–15.0)
MCH: 27.6 pg (ref 26.0–34.0)
MCHC: 33.4 g/dL (ref 30.0–36.0)
MCV: 82.5 fL (ref 78.0–100.0)
PLATELETS: 276 10*3/uL (ref 150–400)
RBC: 4.35 MIL/uL (ref 3.87–5.11)
RDW: 14.4 % (ref 11.5–15.5)
WBC: 12.2 10*3/uL — ABNORMAL HIGH (ref 4.0–10.5)

## 2015-08-06 NOTE — Progress Notes (Signed)
Subjective: 2 Days Post-Op Procedure(s) (LRB): TOTAL RIGHT KNEE ARTHROPLASTY (Right) Patient reports pain as mild.  No nausea/vomiting, lightheadedness/dizziness, chest pain/sob.    Objective: Vital signs in last 24 hours: Temp:  [97.9 F (36.6 C)-98.2 F (36.8 C)] 97.9 F (36.6 C) (12/15 2300) Pulse Rate:  [86-90] 90 (12/15 2300) Resp:  [17-18] 17 (12/15 2300) BP: (139-141)/(52-59) 139/59 mmHg (12/15 2300) SpO2:  [98 %] 98 % (12/15 2300)  Intake/Output from previous day: 12/15 0701 - 12/16 0700 In: 1080 [P.O.:1080] Out: -  Intake/Output this shift:     Recent Labs  08/05/15 0555 08/06/15 0520  HGB 11.9* 12.0    Recent Labs  08/05/15 0555 08/06/15 0520  WBC 9.0 12.2*  RBC 4.37 4.35  HCT 36.3 35.9*  PLT 255 276    Recent Labs  08/05/15 0555 08/06/15 0520  NA 136 138  K 3.9 3.9  CL 105 106  CO2 25 24  BUN 7 9  CREATININE 0.62 0.64  GLUCOSE 141* 122*  CALCIUM 8.4* 9.0   No results for input(s): LABPT, INR in the last 72 hours.  Neurologically intact Neurovascular intact Sensation intact distally Intact pulses distally Dorsiflexion/Plantar flexion intact Incision: dressing C/D/I No cellulitis present Compartment soft  Dressing changed by me today  Assessment/Plan: 2 Days Post-Op Procedure(s) (LRB): TOTAL RIGHT KNEE ARTHROPLASTY (Right) Advance diet Up with therapy Discharge home with home health today following first session of PT WBAT RLE ABLA-mild and stable Place ted hose BLE prior to d/c  Fannie Knee 08/06/2015, 8:06 AM

## 2015-08-06 NOTE — Progress Notes (Signed)
Occupational Therapy Treatment Patient Details Name: Katie [name] MRN: [id number] DOB: 11-22-1945 Today's Date: 08/06/2015    History of present illness Pt admitted for R TKA with only PMHx of restless leg syndrome   OT comments  Pt. Making gains with skilled OT.  Able to complete tub transfer with cues for walker management and safety.  Eager for d/c home.    Follow Up Recommendations  No OT follow up;Supervision/Assistance - 24 hour    Equipment Recommendations  3 in 1 bedside comode;Tub/shower bench    Recommendations for Other Services      Precautions / Restrictions Precautions Precautions: Knee Restrictions Weight Bearing Restrictions: Yes RLE Weight Bearing: Weight bearing as tolerated       Mobility Bed Mobility Overal bed mobility: Modified Independent             General bed mobility comments: In chair upon arrival  Transfers Overall transfer level: Needs assistance Equipment used: Rolling walker (2 wheeled) Transfers: Sit to/from [facility] Sit to Stand: Supervision         General transfer comment: cues for walker management    Balance Overall balance assessment: Needs assistance Sitting-balance support: Feet unsupported;No upper extremity supported Sitting balance-Leahy Scale: Normal     Standing balance support: Single extremity supported;During functional activity Standing balance-Leahy Scale: Good Standing balance comment: Was able to maintain stability with only use of cane during amb                   ADL Overall ADL's : Needs assistance/impaired     Grooming: Supervision/safety;Standing               Lower Body Dressing: Min guard;Sitting/lateral leans   Toilet Transfer: Min guard;Ambulation;RW Toilet Transfer Details (indicate cue type and reason): simulated during transfers in room Toileting- Clothing Manipulation and Hygiene: Sit to/from stand;Min guard Toileting - Clothing Manipulation  Details (indicate cue type and reason): simulated during transfers in room Tub/ Shower Transfer: Tub transfer;3 in 1;Minimal assistance;Rolling walker;Cueing for safety;Cueing for sequencing Tub/Shower Transfer Details (indicate cue type and reason): return demo for simulated tub transfer with use of 3n1 and right faucet, multiple cues for walker safety and management Functional mobility during ADLs: Min guard General ADL Comments: requires cues for slowing pace and walker management.  eager for d/c home       Vision                     Perception     Praxis      Cognition   Behavior During Therapy: [facility] for tasks assessed/performed Overall Cognitive Status: Within Functional Limits for tasks assessed                       Extremity/Trunk Assessment               Exercises Total Joint Exercises Towel Squeeze: AROM;10 reps;Both;Seated Heel Slides: AROM;Right;15 reps;Supine Hip ABduction/ADduction: AROM;Right;15 reps;Supine Straight Leg Raises: AROM;Right;15 reps;Supine Long Arc Quad: AROM;Right;15 reps;Seated Knee Flexion: AROM;15 reps;Right;Seated Goniometric ROM: 3-80 Marching in Standing: AROM;Both;15 reps;Seated   Shoulder Instructions       General Comments      Pertinent Vitals/ Pain       Pain Assessment: No/denies pain Pain Intervention(s): Monitored during session;Repositioned  Home Living  Prior Functioning/Environment              Frequency Min 2X/week     Progress Toward Goals  OT Goals(current goals can now be found in the care plan section)  Progress towards OT goals: Progressing toward goals     Plan Discharge plan remains appropriate    Co-evaluation                 End of Session Equipment Utilized During Treatment: Gait belt;Rolling walker CPM Right Knee CPM Right Knee: Off   Activity Tolerance Patient tolerated treatment well   Patient Left  in chair;with call bell/phone within reach   Nurse Communication  (spoke with CNA, pt. requesting a shower before d/c home)        Time: 1001-1015 OT Time Calculation (min): 14 min  Charges: OT General Charges $OT Visit: 1 Procedure OT Treatments $Self Care/Home Management : 8-22 mins  Janice Coffin, COTA/L 08/06/2015, 10:26 AM

## 2015-08-06 NOTE — Progress Notes (Signed)
Physical Therapy Treatment Patient Details Name: Katie [name] MRN: [id number] DOB: May 10, 1946 Today's Date: 08/06/2015    History of Present Illness Pt admitted for R TKA with only PMHx of restless leg syndrome    PT Comments    Pt continues to progress her independence with ambulation and LE strength/ROM. She performed ambulation adequately with cane but needs to continue to work on appropriate gait pattern. She is on track to safely D/C once medically cleared.   Follow Up Recommendations  Home health PT     Equipment Recommendations  Rolling walker with 5" wheels;3in1 (PT)    Recommendations for Other Services       Precautions / Restrictions Precautions Precautions: Knee Restrictions Weight Bearing Restrictions: Yes RLE Weight Bearing: Weight bearing as tolerated    Mobility  Bed Mobility Overal bed mobility: Modified Independent                Transfers Overall transfer level: Needs assistance   Transfers: Sit to/from Stand Sit to Stand: Supervision         General transfer comment: cues for controlled descent to surface  Ambulation/Gait Ambulation/Gait assistance: Supervision Ambulation Distance (Feet): 180 Feet Assistive device: Rolling walker (2 wheeled);Straight cane Gait Pattern/deviations: Step-through pattern;Decreased stride length   Gait velocity interpretation: Below normal speed for age/gender General Gait Details: Cues for cane sequencing, heel strike, toe off, and increasing knee flexion during swing phase.    Stairs            Wheelchair Mobility    Modified Rankin (Stroke Patients Only)       Balance Overall balance assessment: Needs assistance Sitting-balance support: Feet unsupported;No upper extremity supported Sitting balance-Leahy Scale: Normal     Standing balance support: Single extremity supported;During functional activity Standing balance-Leahy Scale: Good Standing balance comment: Was able to  maintain stability with only use of cane during amb                    Cognition Arousal/Alertness: Awake/alert Behavior During Therapy: WFL for tasks assessed/performed Overall Cognitive Status: Within Functional Limits for tasks assessed                      Exercises Total Joint Exercises Towel Squeeze: AROM;10 reps;Both;Seated Heel Slides: AROM;Right;15 reps;Supine Hip ABduction/ADduction: AROM;Right;15 reps;Supine Straight Leg Raises: AROM;Right;15 reps;Supine Long Arc Quad: AROM;Right;15 reps;Seated Knee Flexion: AROM;15 reps;Right;Seated Goniometric ROM: 3-80 Marching in Standing: AROM;Both;15 reps;Seated    General Comments        Pertinent Vitals/Pain Pain Assessment: No/denies pain Pain Intervention(s): Monitored during session;Repositioned    Home Living                      Prior Function            PT Goals (current goals can now be found in the care plan section) Progress towards PT goals: Progressing toward goals    Frequency       PT Plan Current plan remains appropriate    Co-evaluation             End of Session Equipment Utilized During Treatment: Gait belt Activity Tolerance: Patient tolerated treatment well Patient left: in chair;with call bell/phone within reach     Time: 0820-0839 PT Time Calculation (min) (ACUTE ONLY): 19 min  Charges:  $Gait Training: 8-22 mins                    G Codes:  Haynes Bast 08/06/2015, 9:41 AM Haynes Bast, SPT 08/06/2015 9:41 AM

## 2015-08-06 NOTE — Care Management Note (Signed)
Case Management Note  Patient Details  Name: Katie [name] MRN: [id number] Date of Birth: Jul 19, 1946  Subjective/Objective:          S/p right total knee arthroplasty          Action/Plan: Set up with [facility] for HHPT by MD office. Spoke with patient on 08/05/15, no change in discharge plan. Patient stated that she will have family to assist her after discharge. [employer] will provide CPM, rolling walker and 3N1.   Expected Discharge Date:  08/06/15               Expected Discharge Plan:  [place]  In-House Referral:  NA  Discharge planning Services  CM Consult  Post Acute Care Choice:  Durable Medical Equipment, Home Health Choice offered to:  Patient  DME Arranged:  3-N-1, CPM, Walker rolling DME Agency:  [employer]  HH Arranged:  PT HH Agency:  [facility]  Status of Service:  Completed, signed off  Medicare Important Message Given:    Date Medicare IM Given:    Medicare IM give by:    Date Additional Medicare IM Given:    Additional Medicare Important Message give by:     If discussed at [place] of Stay Meetings, dates discussed:    Additional Comments:  Nila Nephew, RN 08/06/2015, 9:00 AM

## 2015-08-07 DIAGNOSIS — Z471 Aftercare following joint replacement surgery: Secondary | ICD-10-CM | POA: Diagnosis not present

## 2015-08-07 DIAGNOSIS — R262 Difficulty in walking, not elsewhere classified: Secondary | ICD-10-CM | POA: Diagnosis not present

## 2015-08-07 DIAGNOSIS — M6281 Muscle weakness (generalized): Secondary | ICD-10-CM | POA: Diagnosis not present

## 2015-08-07 DIAGNOSIS — Z6827 Body mass index (BMI) 27.0-27.9, adult: Secondary | ICD-10-CM | POA: Diagnosis not present

## 2015-08-07 DIAGNOSIS — Z79891 Long term (current) use of opiate analgesic: Secondary | ICD-10-CM | POA: Diagnosis not present

## 2015-08-07 DIAGNOSIS — Z96651 Presence of right artificial knee joint: Secondary | ICD-10-CM | POA: Diagnosis not present

## 2015-08-09 DIAGNOSIS — Z79891 Long term (current) use of opiate analgesic: Secondary | ICD-10-CM | POA: Diagnosis not present

## 2015-08-09 DIAGNOSIS — R262 Difficulty in walking, not elsewhere classified: Secondary | ICD-10-CM | POA: Diagnosis not present

## 2015-08-09 DIAGNOSIS — Z96651 Presence of right artificial knee joint: Secondary | ICD-10-CM | POA: Diagnosis not present

## 2015-08-09 DIAGNOSIS — Z6827 Body mass index (BMI) 27.0-27.9, adult: Secondary | ICD-10-CM | POA: Diagnosis not present

## 2015-08-09 DIAGNOSIS — Z471 Aftercare following joint replacement surgery: Secondary | ICD-10-CM | POA: Diagnosis not present

## 2015-08-09 DIAGNOSIS — M6281 Muscle weakness (generalized): Secondary | ICD-10-CM | POA: Diagnosis not present

## 2015-08-11 DIAGNOSIS — Z79891 Long term (current) use of opiate analgesic: Secondary | ICD-10-CM | POA: Diagnosis not present

## 2015-08-11 DIAGNOSIS — Z96651 Presence of right artificial knee joint: Secondary | ICD-10-CM | POA: Diagnosis not present

## 2015-08-11 DIAGNOSIS — M6281 Muscle weakness (generalized): Secondary | ICD-10-CM | POA: Diagnosis not present

## 2015-08-11 DIAGNOSIS — Z471 Aftercare following joint replacement surgery: Secondary | ICD-10-CM | POA: Diagnosis not present

## 2015-08-11 DIAGNOSIS — R262 Difficulty in walking, not elsewhere classified: Secondary | ICD-10-CM | POA: Diagnosis not present

## 2015-08-11 DIAGNOSIS — Z6827 Body mass index (BMI) 27.0-27.9, adult: Secondary | ICD-10-CM | POA: Diagnosis not present

## 2015-08-12 DIAGNOSIS — Z96651 Presence of right artificial knee joint: Secondary | ICD-10-CM | POA: Diagnosis not present

## 2015-08-12 DIAGNOSIS — Z471 Aftercare following joint replacement surgery: Secondary | ICD-10-CM | POA: Diagnosis not present

## 2015-08-12 DIAGNOSIS — M6281 Muscle weakness (generalized): Secondary | ICD-10-CM | POA: Diagnosis not present

## 2015-08-12 DIAGNOSIS — Z6827 Body mass index (BMI) 27.0-27.9, adult: Secondary | ICD-10-CM | POA: Diagnosis not present

## 2015-08-12 DIAGNOSIS — R262 Difficulty in walking, not elsewhere classified: Secondary | ICD-10-CM | POA: Diagnosis not present

## 2015-08-12 DIAGNOSIS — Z79891 Long term (current) use of opiate analgesic: Secondary | ICD-10-CM | POA: Diagnosis not present

## 2015-08-16 DIAGNOSIS — Z6827 Body mass index (BMI) 27.0-27.9, adult: Secondary | ICD-10-CM | POA: Diagnosis not present

## 2015-08-16 DIAGNOSIS — Z96651 Presence of right artificial knee joint: Secondary | ICD-10-CM | POA: Diagnosis not present

## 2015-08-16 DIAGNOSIS — M6281 Muscle weakness (generalized): Secondary | ICD-10-CM | POA: Diagnosis not present

## 2015-08-16 DIAGNOSIS — Z79891 Long term (current) use of opiate analgesic: Secondary | ICD-10-CM | POA: Diagnosis not present

## 2015-08-16 DIAGNOSIS — R262 Difficulty in walking, not elsewhere classified: Secondary | ICD-10-CM | POA: Diagnosis not present

## 2015-08-16 DIAGNOSIS — Z471 Aftercare following joint replacement surgery: Secondary | ICD-10-CM | POA: Diagnosis not present

## 2015-08-17 DIAGNOSIS — Z96651 Presence of right artificial knee joint: Secondary | ICD-10-CM | POA: Diagnosis not present

## 2015-08-17 DIAGNOSIS — M25561 Pain in right knee: Secondary | ICD-10-CM | POA: Diagnosis not present

## 2015-08-17 DIAGNOSIS — R262 Difficulty in walking, not elsewhere classified: Secondary | ICD-10-CM | POA: Diagnosis not present

## 2015-08-17 DIAGNOSIS — M25661 Stiffness of right knee, not elsewhere classified: Secondary | ICD-10-CM | POA: Diagnosis not present

## 2015-08-18 DIAGNOSIS — M25561 Pain in right knee: Secondary | ICD-10-CM | POA: Diagnosis not present

## 2015-08-18 DIAGNOSIS — R262 Difficulty in walking, not elsewhere classified: Secondary | ICD-10-CM | POA: Diagnosis not present

## 2015-08-18 DIAGNOSIS — Z96651 Presence of right artificial knee joint: Secondary | ICD-10-CM | POA: Diagnosis not present

## 2015-08-18 DIAGNOSIS — M25661 Stiffness of right knee, not elsewhere classified: Secondary | ICD-10-CM | POA: Diagnosis not present

## 2015-08-19 DIAGNOSIS — M25661 Stiffness of right knee, not elsewhere classified: Secondary | ICD-10-CM | POA: Diagnosis not present

## 2015-08-19 DIAGNOSIS — M25561 Pain in right knee: Secondary | ICD-10-CM | POA: Diagnosis not present

## 2015-08-19 DIAGNOSIS — R262 Difficulty in walking, not elsewhere classified: Secondary | ICD-10-CM | POA: Diagnosis not present

## 2015-08-19 DIAGNOSIS — Z96651 Presence of right artificial knee joint: Secondary | ICD-10-CM | POA: Diagnosis not present

## 2015-08-20 DIAGNOSIS — R262 Difficulty in walking, not elsewhere classified: Secondary | ICD-10-CM | POA: Diagnosis not present

## 2015-08-20 DIAGNOSIS — M25661 Stiffness of right knee, not elsewhere classified: Secondary | ICD-10-CM | POA: Diagnosis not present

## 2015-08-20 DIAGNOSIS — M25561 Pain in right knee: Secondary | ICD-10-CM | POA: Diagnosis not present

## 2015-08-20 DIAGNOSIS — Z96651 Presence of right artificial knee joint: Secondary | ICD-10-CM | POA: Diagnosis not present

## 2015-08-24 DIAGNOSIS — Z96651 Presence of right artificial knee joint: Secondary | ICD-10-CM | POA: Diagnosis not present

## 2015-08-24 DIAGNOSIS — M25661 Stiffness of right knee, not elsewhere classified: Secondary | ICD-10-CM | POA: Diagnosis not present

## 2015-08-24 DIAGNOSIS — R262 Difficulty in walking, not elsewhere classified: Secondary | ICD-10-CM | POA: Diagnosis not present

## 2015-08-24 DIAGNOSIS — M25561 Pain in right knee: Secondary | ICD-10-CM | POA: Diagnosis not present

## 2015-08-25 DIAGNOSIS — M25561 Pain in right knee: Secondary | ICD-10-CM | POA: Diagnosis not present

## 2015-08-25 DIAGNOSIS — Z96651 Presence of right artificial knee joint: Secondary | ICD-10-CM | POA: Diagnosis not present

## 2015-08-25 DIAGNOSIS — M25661 Stiffness of right knee, not elsewhere classified: Secondary | ICD-10-CM | POA: Diagnosis not present

## 2015-08-25 DIAGNOSIS — R262 Difficulty in walking, not elsewhere classified: Secondary | ICD-10-CM | POA: Diagnosis not present

## 2015-08-26 DIAGNOSIS — Z96651 Presence of right artificial knee joint: Secondary | ICD-10-CM | POA: Diagnosis not present

## 2015-08-26 DIAGNOSIS — M25561 Pain in right knee: Secondary | ICD-10-CM | POA: Diagnosis not present

## 2015-08-26 DIAGNOSIS — R262 Difficulty in walking, not elsewhere classified: Secondary | ICD-10-CM | POA: Diagnosis not present

## 2015-08-26 DIAGNOSIS — M25661 Stiffness of right knee, not elsewhere classified: Secondary | ICD-10-CM | POA: Diagnosis not present

## 2015-09-03 DIAGNOSIS — R262 Difficulty in walking, not elsewhere classified: Secondary | ICD-10-CM | POA: Diagnosis not present

## 2015-09-03 DIAGNOSIS — Z96651 Presence of right artificial knee joint: Secondary | ICD-10-CM | POA: Diagnosis not present

## 2015-09-03 DIAGNOSIS — M25561 Pain in right knee: Secondary | ICD-10-CM | POA: Diagnosis not present

## 2015-09-03 DIAGNOSIS — M25661 Stiffness of right knee, not elsewhere classified: Secondary | ICD-10-CM | POA: Diagnosis not present

## 2015-09-10 DIAGNOSIS — M25561 Pain in right knee: Secondary | ICD-10-CM | POA: Diagnosis not present

## 2015-09-10 DIAGNOSIS — Z96651 Presence of right artificial knee joint: Secondary | ICD-10-CM | POA: Diagnosis not present

## 2015-09-10 DIAGNOSIS — M25661 Stiffness of right knee, not elsewhere classified: Secondary | ICD-10-CM | POA: Diagnosis not present

## 2015-09-10 DIAGNOSIS — R262 Difficulty in walking, not elsewhere classified: Secondary | ICD-10-CM | POA: Diagnosis not present

## 2015-09-13 DIAGNOSIS — M25661 Stiffness of right knee, not elsewhere classified: Secondary | ICD-10-CM | POA: Diagnosis not present

## 2015-09-13 DIAGNOSIS — M25561 Pain in right knee: Secondary | ICD-10-CM | POA: Diagnosis not present

## 2015-09-13 DIAGNOSIS — Z96651 Presence of right artificial knee joint: Secondary | ICD-10-CM | POA: Diagnosis not present

## 2015-09-13 DIAGNOSIS — R262 Difficulty in walking, not elsewhere classified: Secondary | ICD-10-CM | POA: Diagnosis not present

## 2015-09-17 DIAGNOSIS — M25561 Pain in right knee: Secondary | ICD-10-CM | POA: Diagnosis not present

## 2015-09-17 DIAGNOSIS — R262 Difficulty in walking, not elsewhere classified: Secondary | ICD-10-CM | POA: Diagnosis not present

## 2015-09-17 DIAGNOSIS — M25661 Stiffness of right knee, not elsewhere classified: Secondary | ICD-10-CM | POA: Diagnosis not present

## 2015-09-17 DIAGNOSIS — Z96651 Presence of right artificial knee joint: Secondary | ICD-10-CM | POA: Diagnosis not present

## 2015-09-23 DIAGNOSIS — M25561 Pain in right knee: Secondary | ICD-10-CM | POA: Diagnosis not present

## 2015-09-23 DIAGNOSIS — Z96651 Presence of right artificial knee joint: Secondary | ICD-10-CM | POA: Diagnosis not present

## 2015-09-23 DIAGNOSIS — M25661 Stiffness of right knee, not elsewhere classified: Secondary | ICD-10-CM | POA: Diagnosis not present

## 2015-09-23 DIAGNOSIS — R262 Difficulty in walking, not elsewhere classified: Secondary | ICD-10-CM | POA: Diagnosis not present

## 2015-09-24 DIAGNOSIS — M25561 Pain in right knee: Secondary | ICD-10-CM | POA: Diagnosis not present

## 2015-09-24 DIAGNOSIS — M25661 Stiffness of right knee, not elsewhere classified: Secondary | ICD-10-CM | POA: Diagnosis not present

## 2015-09-24 DIAGNOSIS — Z96651 Presence of right artificial knee joint: Secondary | ICD-10-CM | POA: Diagnosis not present

## 2015-09-24 DIAGNOSIS — R262 Difficulty in walking, not elsewhere classified: Secondary | ICD-10-CM | POA: Diagnosis not present

## 2015-09-30 DIAGNOSIS — M25661 Stiffness of right knee, not elsewhere classified: Secondary | ICD-10-CM | POA: Diagnosis not present

## 2015-09-30 DIAGNOSIS — R262 Difficulty in walking, not elsewhere classified: Secondary | ICD-10-CM | POA: Diagnosis not present

## 2015-09-30 DIAGNOSIS — M25561 Pain in right knee: Secondary | ICD-10-CM | POA: Diagnosis not present

## 2015-09-30 DIAGNOSIS — Z96651 Presence of right artificial knee joint: Secondary | ICD-10-CM | POA: Diagnosis not present

## 2015-10-01 DIAGNOSIS — M25561 Pain in right knee: Secondary | ICD-10-CM | POA: Diagnosis not present

## 2015-10-01 DIAGNOSIS — M25661 Stiffness of right knee, not elsewhere classified: Secondary | ICD-10-CM | POA: Diagnosis not present

## 2015-10-01 DIAGNOSIS — R262 Difficulty in walking, not elsewhere classified: Secondary | ICD-10-CM | POA: Diagnosis not present

## 2015-10-01 DIAGNOSIS — Z96651 Presence of right artificial knee joint: Secondary | ICD-10-CM | POA: Diagnosis not present

## 2015-10-04 DIAGNOSIS — M25561 Pain in right knee: Secondary | ICD-10-CM | POA: Diagnosis not present

## 2015-10-04 DIAGNOSIS — Z96651 Presence of right artificial knee joint: Secondary | ICD-10-CM | POA: Diagnosis not present

## 2015-10-04 DIAGNOSIS — R262 Difficulty in walking, not elsewhere classified: Secondary | ICD-10-CM | POA: Diagnosis not present

## 2015-10-04 DIAGNOSIS — M25661 Stiffness of right knee, not elsewhere classified: Secondary | ICD-10-CM | POA: Diagnosis not present

## 2015-10-21 DIAGNOSIS — Z96651 Presence of right artificial knee joint: Secondary | ICD-10-CM | POA: Diagnosis not present

## 2015-10-21 DIAGNOSIS — M25661 Stiffness of right knee, not elsewhere classified: Secondary | ICD-10-CM | POA: Diagnosis not present

## 2015-10-21 DIAGNOSIS — M25561 Pain in right knee: Secondary | ICD-10-CM | POA: Diagnosis not present

## 2015-10-21 DIAGNOSIS — R262 Difficulty in walking, not elsewhere classified: Secondary | ICD-10-CM | POA: Diagnosis not present

## 2015-10-22 DIAGNOSIS — M25661 Stiffness of right knee, not elsewhere classified: Secondary | ICD-10-CM | POA: Diagnosis not present

## 2015-10-22 DIAGNOSIS — M25561 Pain in right knee: Secondary | ICD-10-CM | POA: Diagnosis not present

## 2015-10-22 DIAGNOSIS — Z96651 Presence of right artificial knee joint: Secondary | ICD-10-CM | POA: Diagnosis not present

## 2015-10-22 DIAGNOSIS — R262 Difficulty in walking, not elsewhere classified: Secondary | ICD-10-CM | POA: Diagnosis not present

## 2015-10-25 DIAGNOSIS — R262 Difficulty in walking, not elsewhere classified: Secondary | ICD-10-CM | POA: Diagnosis not present

## 2015-10-25 DIAGNOSIS — M25561 Pain in right knee: Secondary | ICD-10-CM | POA: Diagnosis not present

## 2015-10-25 DIAGNOSIS — M25661 Stiffness of right knee, not elsewhere classified: Secondary | ICD-10-CM | POA: Diagnosis not present

## 2015-10-25 DIAGNOSIS — Z96651 Presence of right artificial knee joint: Secondary | ICD-10-CM | POA: Diagnosis not present

## 2015-10-28 DIAGNOSIS — M25661 Stiffness of right knee, not elsewhere classified: Secondary | ICD-10-CM | POA: Diagnosis not present

## 2015-10-28 DIAGNOSIS — R262 Difficulty in walking, not elsewhere classified: Secondary | ICD-10-CM | POA: Diagnosis not present

## 2015-10-28 DIAGNOSIS — M25561 Pain in right knee: Secondary | ICD-10-CM | POA: Diagnosis not present

## 2015-10-28 DIAGNOSIS — Z96651 Presence of right artificial knee joint: Secondary | ICD-10-CM | POA: Diagnosis not present

## 2015-10-29 DIAGNOSIS — Z96651 Presence of right artificial knee joint: Secondary | ICD-10-CM | POA: Diagnosis not present

## 2015-10-29 DIAGNOSIS — M25561 Pain in right knee: Secondary | ICD-10-CM | POA: Diagnosis not present

## 2015-10-29 DIAGNOSIS — R262 Difficulty in walking, not elsewhere classified: Secondary | ICD-10-CM | POA: Diagnosis not present

## 2015-10-29 DIAGNOSIS — M25661 Stiffness of right knee, not elsewhere classified: Secondary | ICD-10-CM | POA: Diagnosis not present

## 2015-11-04 DIAGNOSIS — R262 Difficulty in walking, not elsewhere classified: Secondary | ICD-10-CM | POA: Diagnosis not present

## 2015-11-04 DIAGNOSIS — M25661 Stiffness of right knee, not elsewhere classified: Secondary | ICD-10-CM | POA: Diagnosis not present

## 2015-11-04 DIAGNOSIS — Z96651 Presence of right artificial knee joint: Secondary | ICD-10-CM | POA: Diagnosis not present

## 2015-11-04 DIAGNOSIS — M25561 Pain in right knee: Secondary | ICD-10-CM | POA: Diagnosis not present

## 2015-11-05 DIAGNOSIS — M25561 Pain in right knee: Secondary | ICD-10-CM | POA: Diagnosis not present

## 2015-11-21 DIAGNOSIS — N390 Urinary tract infection, site not specified: Secondary | ICD-10-CM | POA: Diagnosis not present

## 2015-12-26 DIAGNOSIS — N390 Urinary tract infection, site not specified: Secondary | ICD-10-CM | POA: Diagnosis not present

## 2016-01-03 ENCOUNTER — Other Ambulatory Visit: Payer: Self-pay

## 2016-01-03 DIAGNOSIS — Z1231 Encounter for screening mammogram for malignant neoplasm of breast: Secondary | ICD-10-CM

## 2016-01-27 DIAGNOSIS — Z131 Encounter for screening for diabetes mellitus: Secondary | ICD-10-CM | POA: Diagnosis not present

## 2016-01-27 DIAGNOSIS — Z0000 Encounter for general adult medical examination without abnormal findings: Secondary | ICD-10-CM | POA: Diagnosis not present

## 2016-01-27 DIAGNOSIS — R300 Dysuria: Secondary | ICD-10-CM | POA: Diagnosis not present

## 2016-01-27 DIAGNOSIS — G2581 Restless legs syndrome: Secondary | ICD-10-CM | POA: Diagnosis not present

## 2016-01-27 DIAGNOSIS — E7800 Pure hypercholesterolemia, unspecified: Secondary | ICD-10-CM | POA: Diagnosis not present

## 2016-02-11 ENCOUNTER — Ambulatory Visit
Admission: RE | Admit: 2016-02-11 | Discharge: 2016-02-11 | Disposition: A | Discharge status: Home / Self Care | Payer: Medicare Other | Source: Ambulatory Visit

## 2016-02-11 DIAGNOSIS — Z1231 Encounter for screening mammogram for malignant neoplasm of breast: Secondary | ICD-10-CM | POA: Diagnosis not present

## 2016-03-17 DIAGNOSIS — M545 Low back pain: Secondary | ICD-10-CM | POA: Diagnosis not present

## 2016-03-17 DIAGNOSIS — M25552 Pain in left hip: Secondary | ICD-10-CM | POA: Diagnosis not present

## 2016-05-05 DIAGNOSIS — R197 Diarrhea, unspecified: Secondary | ICD-10-CM | POA: Diagnosis not present

## 2016-05-05 DIAGNOSIS — R1030 Lower abdominal pain, unspecified: Secondary | ICD-10-CM | POA: Diagnosis not present

## 2016-05-11 DIAGNOSIS — R197 Diarrhea, unspecified: Secondary | ICD-10-CM | POA: Diagnosis not present

## 2016-05-26 DIAGNOSIS — Z23 Encounter for immunization: Secondary | ICD-10-CM | POA: Diagnosis not present

## 2016-07-01 DIAGNOSIS — J209 Acute bronchitis, unspecified: Secondary | ICD-10-CM | POA: Diagnosis not present

## 2016-07-01 DIAGNOSIS — J020 Streptococcal pharyngitis: Secondary | ICD-10-CM | POA: Diagnosis not present

## 2016-08-04 DIAGNOSIS — M25561 Pain in right knee: Secondary | ICD-10-CM | POA: Diagnosis not present

## 2017-01-01 ENCOUNTER — Other Ambulatory Visit: Payer: Self-pay | Admitting: Family Medicine

## 2017-01-01 DIAGNOSIS — Z1231 Encounter for screening mammogram for malignant neoplasm of breast: Secondary | ICD-10-CM

## 2017-02-01 DIAGNOSIS — G2581 Restless legs syndrome: Secondary | ICD-10-CM | POA: Diagnosis not present

## 2017-02-01 DIAGNOSIS — E7800 Pure hypercholesterolemia, unspecified: Secondary | ICD-10-CM | POA: Diagnosis not present

## 2017-02-01 DIAGNOSIS — Z131 Encounter for screening for diabetes mellitus: Secondary | ICD-10-CM | POA: Diagnosis not present

## 2017-02-01 DIAGNOSIS — Z1159 Encounter for screening for other viral diseases: Secondary | ICD-10-CM | POA: Diagnosis not present

## 2017-02-01 DIAGNOSIS — Z0000 Encounter for general adult medical examination without abnormal findings: Secondary | ICD-10-CM | POA: Diagnosis not present

## 2017-02-16 ENCOUNTER — Ambulatory Visit: Payer: Self-pay

## 2017-02-23 ENCOUNTER — Ambulatory Visit
Admission: RE | Admit: 2017-02-23 | Discharge: 2017-02-23 | Disposition: A | Discharge status: Home / Self Care | Payer: Medicare Other | Source: Ambulatory Visit | Attending: Family Medicine | Admitting: Family Medicine

## 2017-02-23 DIAGNOSIS — Z1231 Encounter for screening mammogram for malignant neoplasm of breast: Secondary | ICD-10-CM

## 2017-06-09 IMAGING — CR DG KNEE 1-2V PORT*R*
2 series · 2 of 2 positions shown · non-contrast
Comparison: None.

CLINICAL DATA: Post right total knee replacement.

EXAM:
PORTABLE RIGHT KNEE - 1-2 VIEW

[AP]
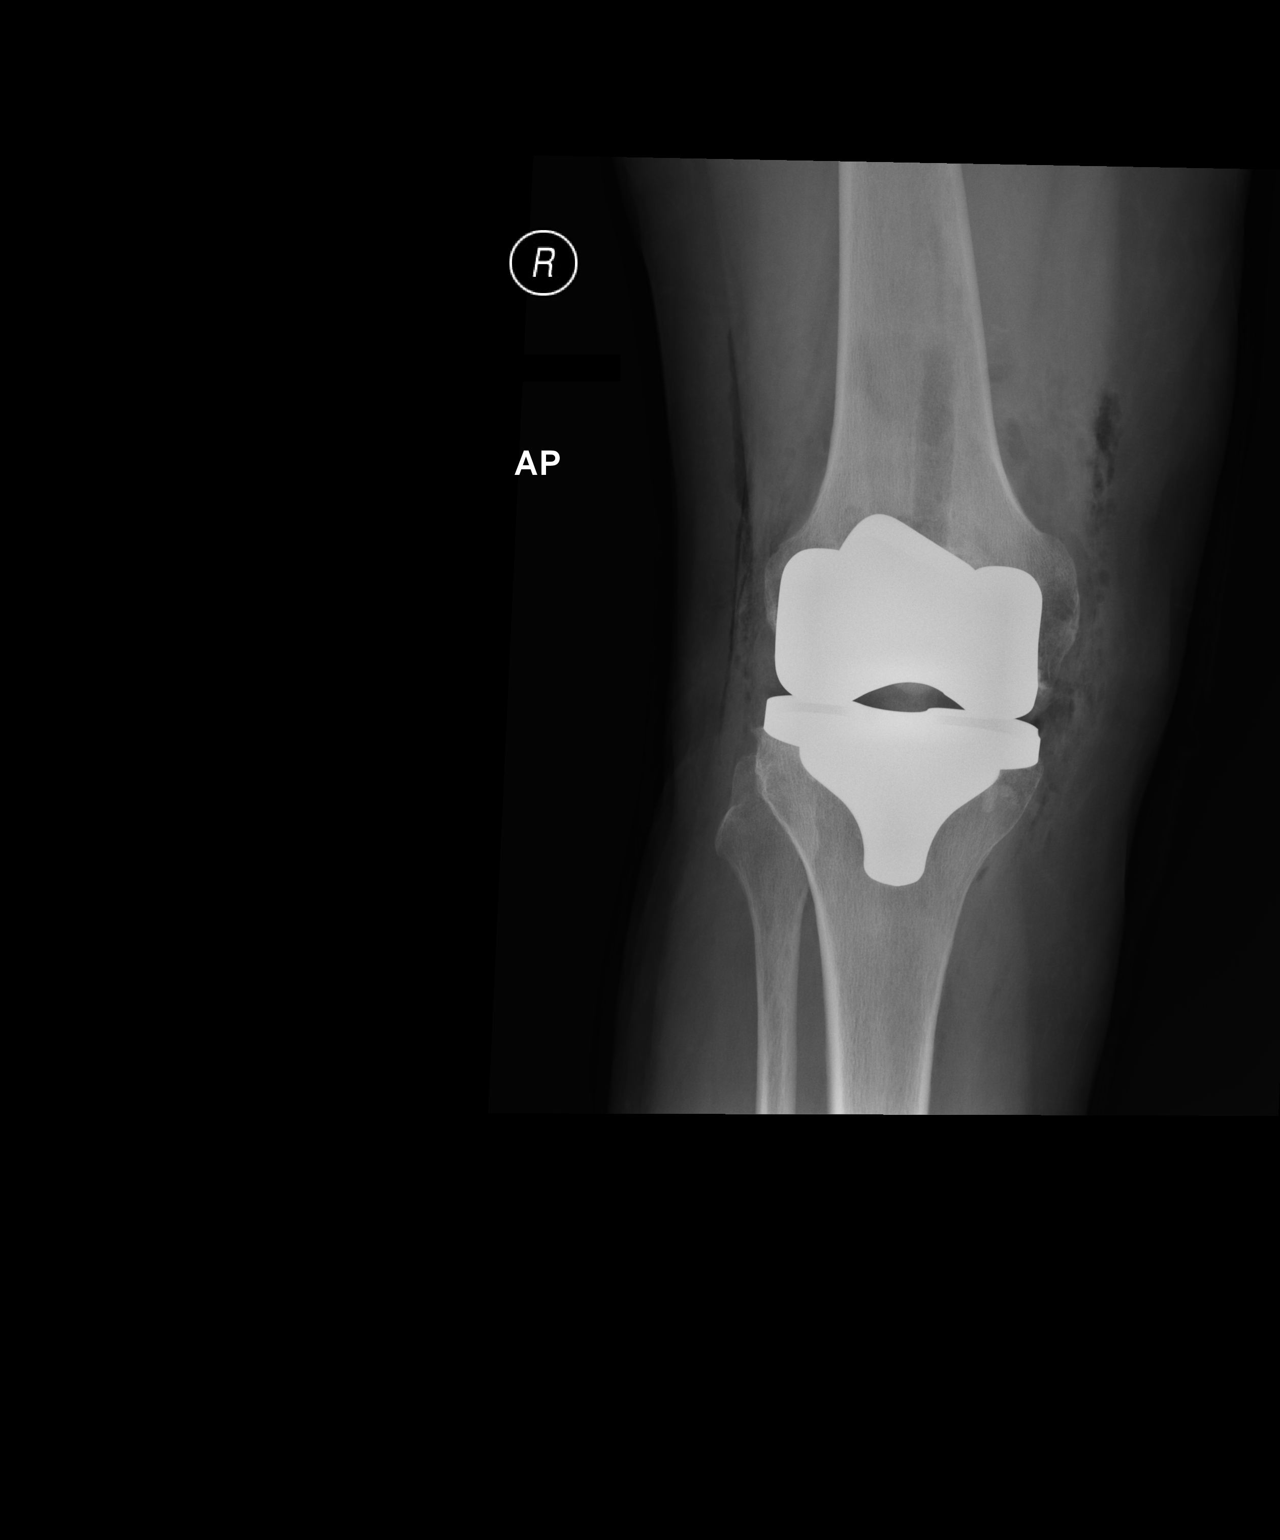

[xtable lateral]
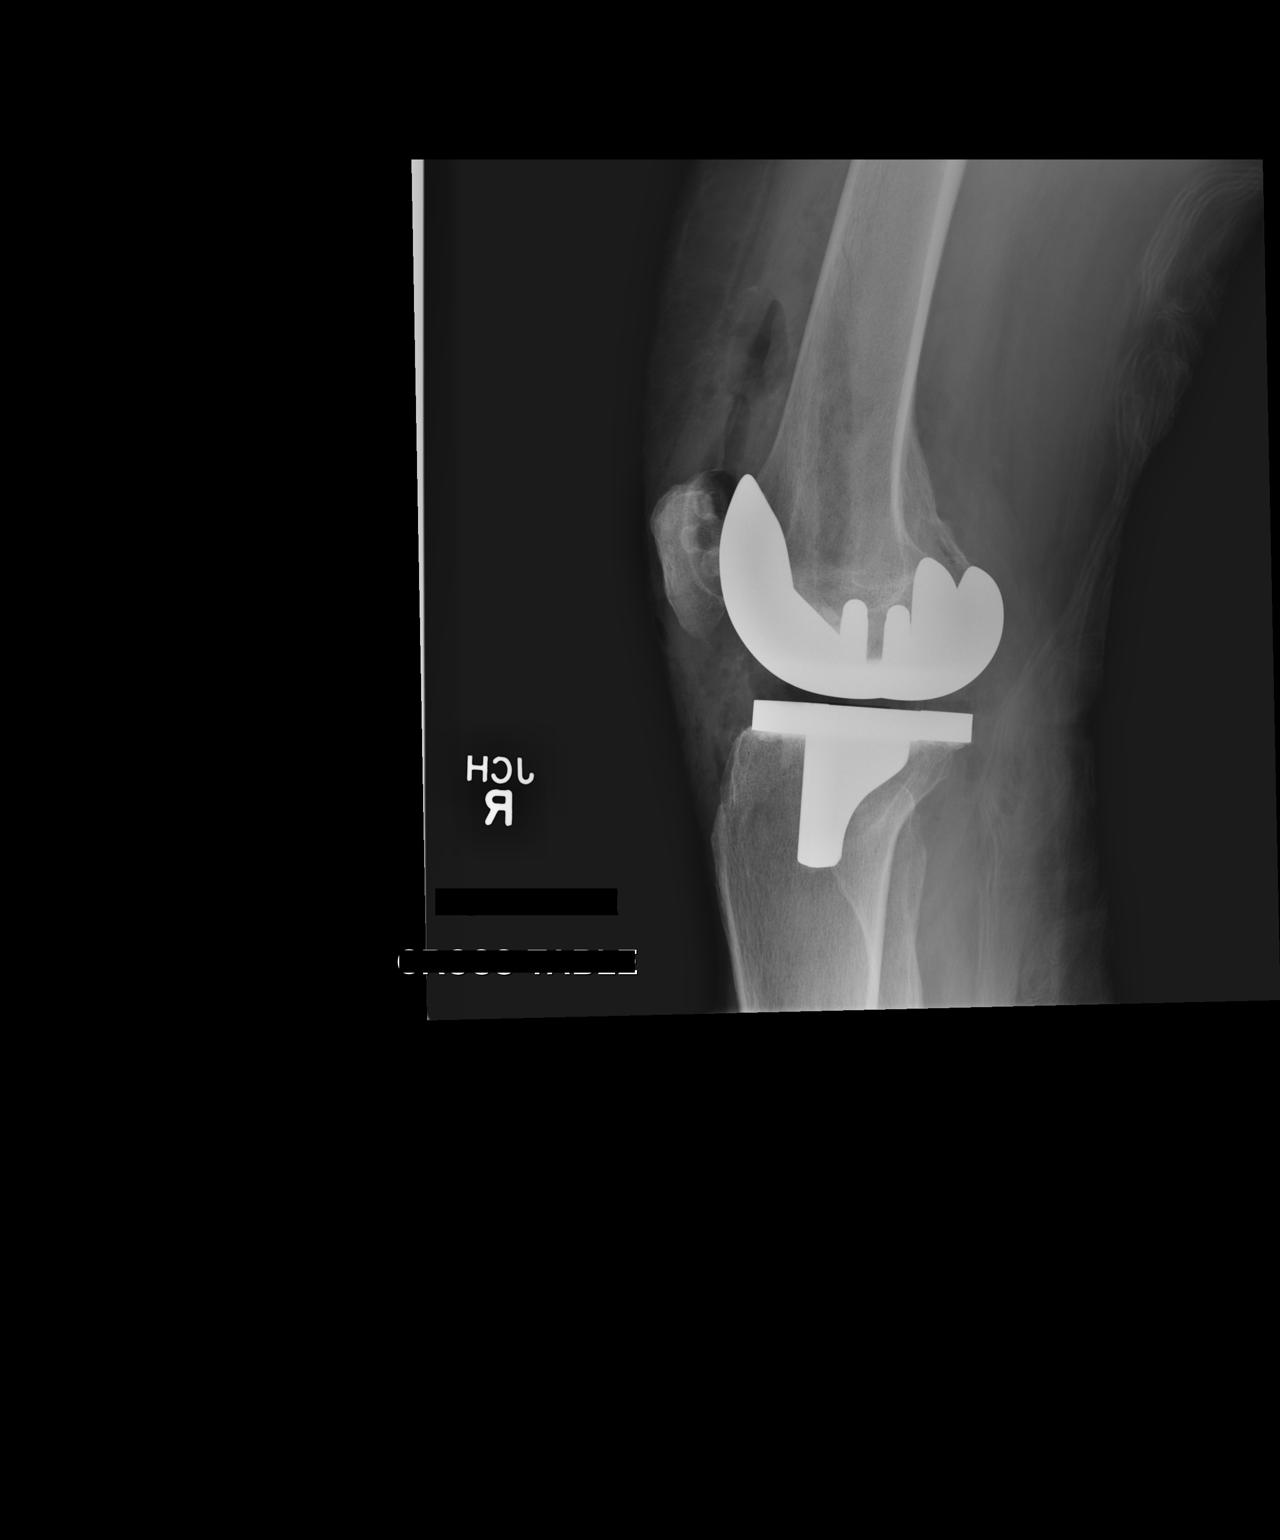

[2 of 2 positions shown; findings below may reference images not displayed]

FINDINGS: Examination demonstrates evidence of patient's recent right knee
arthroplasty with prosthetic components intact and normally located.
Air and fluid are present within the knee joint with air in the
adjacent soft tissues compatible with recent surgery. Remainder of
the exam is within normal.
IMPRESSION: Expected postoperative changes post right knee arthroplasty.

## 2017-06-18 DIAGNOSIS — M1712 Unilateral primary osteoarthritis, left knee: Secondary | ICD-10-CM | POA: Diagnosis present

## 2017-06-18 NOTE — H&P (Signed)
PREOPERATIVE H&P Patient ID: Katie [name] MRN: [id number] DOB/AGE: [birth year] 70 y.o.  Chief Complaint: OA LEFT KNEE  Planned Procedure Date: 07/03/2017 Medical and Cardiac Clearance by Dr. Darcus Austin   HPI: Katie [name] is a 71 y.o. female with a history of Restless leg syndrome.  She is s/p Right TKA 2016 by Dr. Maryla Morrow and presents for evaluation of OA LEFT KNEE. The patient has a history of pain and functional disability in the left knee due to arthritis and has failed non-surgical conservative treatments for greater than 12 weeks to include NSAID's and/or analgesics, corticosteriod injections and activity modification.  Onset of symptoms was gradual, starting 2 years ago with gradually worsening course since that time.  Patient currently rates pain at 6 out of 10 with activity. Patient has night pain, worsening of pain with activity and weight bearing and pain that interferes with activities of daily living.  Patient has evidence of subchondral cysts, subchondral sclerosis, periarticular osteophytes and joint space narrowing by imaging studies. There is no active infection.  Past Medical History:  Diagnosis Date  . Arthritis   . PONV (postoperative nausea and vomiting)    hard to wake up  . Restless leg syndrome    Past Surgical History:  Procedure Laterality Date  . APPENDECTOMY    . menicus tear Right 12/2014  . TONSILLECTOMY    . TOTAL KNEE ARTHROPLASTY Right 08/04/2015   Procedure: TOTAL RIGHT KNEE ARTHROPLASTY;  Surgeon: Ninetta Lights, MD;  Location: [place];  Service: Orthopedics;  Laterality: Right;  . TUBAL LIGATION     Allergies  Allergen Reactions  . Anesthetics, [place] Other (See Comments)    Unspecified  . Other Other (See Comments)    sensitive to anesthesia **slow to wake up per patient** Pain Medications "knocks her out"   Medications: Mirapex 0.5 mg daily  Social History  1/4-1/2 ppd Former smoker x 15-20 years.  Quit 35 years ago.  She is  married and lives with her husband.  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    Family history: Significant for heart disease in one of her grandparents, heart attack in mother and one of her grandparents and cancer in her father.    ROS: Currently denies lightheadedness, dizziness, Fever, chills, CP, SOB.   No personal history of DVT, PE, MI, or CVA. No loose teeth or dentures All other systems have been reviewed and were otherwise currently negative with the exception of those mentioned in the HPI and as above.  Objective: Vitals: Ht: 5 feet 6 inches wt: 160 temp: 97.9 BP: 158/82 pulse: 78 O2 98 % on room air. Physical Exam: General: Alert, NAD.  Antalgic Gait  HEENT: EOMI, Good Neck Extension  Pulm: No increased work of breathing.  Clear B/L A/P w/o crackle or wheeze.  CV: RRR, No m/g/r appreciated  GI: soft, NT, ND Neuro: Neuro without gross focal deficit.  Sensation intact distally Skin: No lesions in the area of chief complaint MSK/Surgical Site: Left knee w/o redness or effusion.  + JLT. ROM 0-95.  5/5 strength in extension and flexion.  +EHL/FHL.  NVI.  Stable to varus and valgus stress.   Imaging Review Plain radiographs demonstrate severe degenerative joint disease of the left knee.   Assessment: OA LEFT KNEE Principal Problem:   Primary osteoarthritis of left knee   Plan: Plan for Procedure(s): TOTAL KNEE ARTHROPLASTY  The patient history, physical exam, clinical judgement of the provider and imaging  are consistent with end stage degenerative joint disease and total joint arthroplasty is deemed medically necessary. The treatment options including medical management, injection therapy, and arthroplasty were discussed at length. The risks and benefits of Procedure(s): TOTAL KNEE ARTHROPLASTY were presented and reviewed.  The risks of nonoperative treatment, versus surgical intervention including but not limited to continued pain, aseptic  loosening, stiffness, dislocation/subluxation, infection, bleeding, nerve injury, blood clots, cardiopulmonary complications, morbidity, mortality, among others were discussed. The patient verbalizes understanding and wishes to proceed with the plan.  Patient is being admitted for inpatient treatment for surgery, pain control, PT, OT, prophylactic antibiotics, VTE prophylaxis, progressive ambulation, ADL's and discharge planning.   Dental prophylaxis discussed and recommended for 2 years postoperatively.   The patient does meet the criteria for TXA which will be used perioperatively via IV.    ASA 325 mg will be used postoperatively for DVT prophylaxis in addition to SCDs, and early ambulation.  The patient is planning to be discharged home with home health services ([facility]) in care of her husband.  Prudencio Burly III, PA-C 06/18/2017 8:29 AM

## 2017-06-19 NOTE — Pre-Procedure Instructions (Signed)
Katie [name]  06/19/2017      [id number] - [place], [place] [address]  [address] Phone: [phone] Fax: [phone]    Your procedure is scheduled on Tuesday, July 03, 2017  Report to [facility] Admitting Entrance "A" at 10:00A.M.  Call this number if you have problems the morning of surgery:  [phone]   Remember:  Do not eat food or drink liquids after midnight.  Take these medicines the morning of surgery with A SIP OF WATER: If needed Acetaminophen (TYLENOL) for pain.  7 days before surgery (Nov. 6), stop taking all Aspirins, Vitamins, Fish oils, and Herbal medications. Also stop all NSAIDS i.e. Advil, Ibuprofen, Motrin, Aleve, Anaprox, Naproxen, BC and Goody Powders.   Do not wear jewelry, make-up or nail polish.  Do not wear lotions, powders, perfumes, or deodorant.  Do not shave 48 hours prior to surgery.   Do not bring valuables to the hospital.  [facility] is not responsible for any belongings or valuables.  Contacts, dentures or bridgework may not be worn into surgery.  Leave your suitcase in the car.  After surgery it may be brought to your room.  For patients admitted to the hospital, discharge time will be determined by your treatment team.  Patients discharged the day of surgery will not be allowed to drive home.   Special instructions:  [place]- Preparing For Surgery  Before surgery, you can play an important role. Because skin is not sterile, your skin needs to be as free of germs as possible. You can reduce the number of germs on your skin by washing with CHG (chlorahexidine gluconate) Soap before surgery.  CHG is an antiseptic cleaner which kills germs and bonds with the skin to continue killing germs even after washing.  Please do not use if you have an allergy to CHG or antibacterial soaps. If your skin becomes reddened/irritated stop using the CHG.  Do not shave (including legs and  underarms) for at least 48 hours prior to first CHG shower. It is OK to shave your face.  Please follow these instructions carefully.   1. Shower the NIGHT BEFORE SURGERY and the MORNING OF SURGERY with CHG.   2. If you chose to wash your hair, wash your hair first as usual with your normal shampoo.  3. After you shampoo, rinse your hair and body thoroughly to remove the shampoo.  4. Use CHG as you would any other liquid soap. You can apply CHG directly to the skin and wash gently with a scrungie or a clean washcloth.   5. Apply the CHG Soap to your body ONLY FROM THE NECK DOWN.  Do not use on open wounds or open sores. Avoid contact with your eyes, ears, mouth and genitals (private parts). Wash Face and genitals (private parts)  with your normal soap.  6. Wash thoroughly, paying special attention to the area where your surgery will be performed.  7. Thoroughly rinse your body with warm water from the neck down.  8. DO NOT shower/wash with your normal soap after using and rinsing off the CHG Soap.  9. Pat yourself dry with a CLEAN TOWEL.  10. Wear CLEAN PAJAMAS to bed the night before surgery, wear comfortable clothes the morning of surgery  11. Place CLEAN SHEETS on your bed the night of your first shower and DO NOT SLEEP WITH PETS.  Day of Surgery: Do not apply any deodorants/lotions. Please wear  clean clothes to the hospital/surgery center.    Please read over the following fact sheets that you were given. Pain Booklet, Coughing and Deep Breathing, MRSA Information and Surgical Site Infection Prevention

## 2017-06-20 ENCOUNTER — Encounter (HOSPITAL_COMMUNITY): Payer: Self-pay

## 2017-06-20 ENCOUNTER — Encounter (HOSPITAL_COMMUNITY)
Admission: RE | Admit: 2017-06-20 | Discharge: 2017-06-20 | Disposition: A | Discharge status: Home / Self Care | Payer: Medicare Other | Source: Ambulatory Visit | Attending: Orthopedic Surgery | Admitting: Orthopedic Surgery

## 2017-06-20 DIAGNOSIS — Z01818 Encounter for other preprocedural examination: Secondary | ICD-10-CM | POA: Insufficient documentation

## 2017-06-20 LAB — SURGICAL PCR SCREEN
MRSA, PCR: NEGATIVE
Staphylococcus aureus: NEGATIVE

## 2017-06-20 LAB — CBC
HEMATOCRIT: 42.6 % (ref 36.0–46.0)
Hemoglobin: 13.9 g/dL (ref 12.0–15.0)
MCH: 26.4 pg (ref 26.0–34.0)
MCHC: 32.6 g/dL (ref 30.0–36.0)
MCV: 81 fL (ref 78.0–100.0)
PLATELETS: 319 10*3/uL (ref 150–400)
RBC: 5.26 MIL/uL — AB (ref 3.87–5.11)
RDW: 15.3 % (ref 11.5–15.5)
WBC: 7 10*3/uL (ref 4.0–10.5)

## 2017-06-20 LAB — BASIC METABOLIC PANEL
Anion gap: 7 (ref 5–15)
BUN: 15 mg/dL (ref 6–20)
CHLORIDE: 105 mmol/L (ref 101–111)
CO2: 26 mmol/L (ref 22–32)
CREATININE: 0.67 mg/dL (ref 0.44–1.00)
Calcium: 9.7 mg/dL (ref 8.9–10.3)
GFR calc Af Amer: >60 mL/min (ref >60)
GFR calc non Af Amer: >60 mL/min (ref >60)
Glucose, Bld: 97 mg/dL (ref 65–99)
Potassium: 4.7 mmol/L (ref 3.5–5.1)
Sodium: 138 mmol/L (ref 135–145)

## 2017-06-20 NOTE — Progress Notes (Signed)
PCP - Dr. Darcus Austin- [place]  Cardiologist - Denies  Chest x-ray - Denies  EKG - Denies  Stress Test - Denies  ECHO - Denies  Cardiac Cath - Denies  Sleep Study - No CPAP - None   Pt denies having chest pain, sob, or fever at this time. All instructions explained to the pt, with a verbal understanding of the material. Pt agrees to go over the instructions while at home for a better understanding. The opportunity to ask questions was provided.

## 2017-07-02 MED ORDER — TRANEXAMIC ACID 1000 MG/10ML IV SOLN
1000.0000 mg | INTRAVENOUS | Status: AC
  Administered 2017-07-03: 1000 mg via INTRAVENOUS
  Filled 2017-07-02: qty 1100

## 2017-07-03 ENCOUNTER — Observation Stay (HOSPITAL_COMMUNITY): Payer: Medicare Other

## 2017-07-03 ENCOUNTER — Ambulatory Visit (HOSPITAL_COMMUNITY): Payer: Medicare Other | Admitting: Anesthesiology

## 2017-07-03 ENCOUNTER — Other Ambulatory Visit: Payer: Self-pay

## 2017-07-03 ENCOUNTER — Observation Stay (HOSPITAL_COMMUNITY)
Admission: RE | Admit: 2017-07-03 | Discharge: 2017-07-04 | Disposition: A | Discharge status: Home / Self Care | Payer: Medicare Other | Source: Ambulatory Visit | Attending: Orthopedic Surgery | Admitting: Orthopedic Surgery

## 2017-07-03 ENCOUNTER — Encounter (HOSPITAL_COMMUNITY): Payer: Self-pay | Admitting: *Deleted

## 2017-07-03 ENCOUNTER — Encounter (HOSPITAL_COMMUNITY)
Admission: RE | Disposition: A | Discharge status: Home / Self Care | Payer: Self-pay | Source: Ambulatory Visit | Attending: Orthopedic Surgery

## 2017-07-03 DIAGNOSIS — Z87891 Personal history of nicotine dependence: Secondary | ICD-10-CM | POA: Diagnosis not present

## 2017-07-03 DIAGNOSIS — Z7982 Long term (current) use of aspirin: Secondary | ICD-10-CM | POA: Insufficient documentation

## 2017-07-03 DIAGNOSIS — G2581 Restless legs syndrome: Secondary | ICD-10-CM | POA: Insufficient documentation

## 2017-07-03 DIAGNOSIS — M1712 Unilateral primary osteoarthritis, left knee: Secondary | ICD-10-CM | POA: Diagnosis present

## 2017-07-03 DIAGNOSIS — Z79899 Other long term (current) drug therapy: Secondary | ICD-10-CM | POA: Diagnosis not present

## 2017-07-03 DIAGNOSIS — Z96659 Presence of unspecified artificial knee joint: Secondary | ICD-10-CM

## 2017-07-03 HISTORY — PX: TOTAL KNEE ARTHROPLASTY: SHX125

## 2017-07-03 SURGERY — ARTHROPLASTY, KNEE, TOTAL
Anesthesia: Monitor Anesthesia Care | Site: Knee | Laterality: Left

## 2017-07-03 MED ORDER — PHENYLEPHRINE HCL 10 MG/ML IJ SOLN
INTRAMUSCULAR | Status: DC | PRN
  Administered 2017-07-03: 80 ug via INTRAVENOUS

## 2017-07-03 MED ORDER — DEXAMETHASONE SODIUM PHOSPHATE 10 MG/ML IJ SOLN
10.0000 mg | Freq: Once | INTRAMUSCULAR | Status: AC
  Administered 2017-07-04: 10 mg via INTRAVENOUS
  Filled 2017-07-03: qty 1

## 2017-07-03 MED ORDER — OXYCODONE HCL 5 MG PO TABS
10.0000 mg | ORAL_TABLET | ORAL | Status: DC | PRN
  Administered 2017-07-03 – 2017-07-04 (×2): 10 mg via ORAL
  Filled 2017-07-03 (×2): qty 2

## 2017-07-03 MED ORDER — CEFAZOLIN SODIUM-DEXTROSE 1-4 GM/50ML-% IV SOLN
1.0000 g | Freq: Four times a day (QID) | INTRAVENOUS | Status: AC
  Administered 2017-07-03 (×2): 1 g via INTRAVENOUS
  Filled 2017-07-03 (×2): qty 50

## 2017-07-03 MED ORDER — METHOCARBAMOL 500 MG PO TABS
500.0000 mg | ORAL_TABLET | Freq: Four times a day (QID) | ORAL | Status: DC | PRN
  Administered 2017-07-03 – 2017-07-04 (×2): 500 mg via ORAL
  Filled 2017-07-03 (×2): qty 1

## 2017-07-03 MED ORDER — SODIUM CHLORIDE FLUSH 0.9 % IV SOLN
INTRAVENOUS | Status: DC | PRN
  Administered 2017-07-03: 30 mL

## 2017-07-03 MED ORDER — ONDANSETRON HCL 4 MG PO TABS: 4.0000 mg | ORAL_TABLET | Freq: Three times a day (TID) | ORAL | 0 refills | Status: AC | PRN

## 2017-07-03 MED ORDER — SODIUM CHLORIDE 0.9 % IR SOLN
Status: DC | PRN
  Administered 2017-07-03: 1000 mL

## 2017-07-03 MED ORDER — LIDOCAINE HCL (CARDIAC) 20 MG/ML IV SOLN
INTRAVENOUS | Status: DC | PRN
  Administered 2017-07-03: 40 mg via INTRATRACHEAL

## 2017-07-03 MED ORDER — LACTATED RINGERS IV SOLN
INTRAVENOUS | Status: DC
  Administered 2017-07-03: via INTRAVENOUS

## 2017-07-03 MED ORDER — GABAPENTIN 300 MG PO CAPS
ORAL_CAPSULE | ORAL | Status: AC
  Filled 2017-07-03: qty 1

## 2017-07-03 MED ORDER — BUPIVACAINE IN DEXTROSE 0.75-8.25 % IT SOLN
INTRATHECAL | Status: DC | PRN
  Administered 2017-07-03: 15 mg via INTRATHECAL

## 2017-07-03 MED ORDER — GABAPENTIN 300 MG PO CAPS
300.0000 mg | ORAL_CAPSULE | Freq: Once | ORAL | Status: AC
  Administered 2017-07-03: 300 mg via ORAL

## 2017-07-03 MED ORDER — ACETAMINOPHEN 500 MG PO TABS
1000.0000 mg | ORAL_TABLET | Freq: Once | ORAL | Status: AC
  Administered 2017-07-03: 1000 mg via ORAL

## 2017-07-03 MED ORDER — HYDROMORPHONE HCL 1 MG/ML IJ SOLN
0.5000 mg | INTRAMUSCULAR | Status: DC | PRN
  Administered 2017-07-03: 0.5 mg via INTRAVENOUS
  Filled 2017-07-03: qty 1

## 2017-07-03 MED ORDER — CELECOXIB 200 MG PO CAPS
200.0000 mg | ORAL_CAPSULE | Freq: Two times a day (BID) | ORAL | Status: DC
  Administered 2017-07-03 – 2017-07-04 (×2): 200 mg via ORAL
  Filled 2017-07-03 (×2): qty 1

## 2017-07-03 MED ORDER — ACETAMINOPHEN 500 MG PO TABS
1000.0000 mg | ORAL_TABLET | Freq: Three times a day (TID) | ORAL | Status: DC
Start: 2017-07-03 — End: 2017-07-04
  Administered 2017-07-03 – 2017-07-04 (×3): 1000 mg via ORAL
  Filled 2017-07-03 (×3): qty 2

## 2017-07-03 MED ORDER — DEXMEDETOMIDINE HCL 200 MCG/2ML IV SOLN
INTRAVENOUS | Status: DC | PRN
  Administered 2017-07-03 (×3): 8 ug via INTRAVENOUS

## 2017-07-03 MED ORDER — OXYCODONE-ACETAMINOPHEN 5-325 MG PO TABS: 1.0000 | ORAL_TABLET | ORAL | 0 refills | Status: AC | PRN

## 2017-07-03 MED ORDER — BUPIVACAINE HCL (PF) 0.25 % IJ SOLN
INTRAMUSCULAR | Status: AC
  Filled 2017-07-03: qty 30

## 2017-07-03 MED ORDER — CELECOXIB 200 MG PO CAPS: 200.0000 mg | ORAL_CAPSULE | Freq: Two times a day (BID) | ORAL | 0 refills | Status: AC

## 2017-07-03 MED ORDER — PROPOFOL 500 MG/50ML IV EMUL
INTRAVENOUS | Status: DC | PRN
  Administered 2017-07-03: 50 ug/kg/min via INTRAVENOUS

## 2017-07-03 MED ORDER — SORBITOL 70 % SOLN: 30.0000 mL | Freq: Every day | Status: DC | PRN

## 2017-07-03 MED ORDER — 0.9 % SODIUM CHLORIDE (POUR BTL) OPTIME
TOPICAL | Status: DC | PRN
  Administered 2017-07-03: 1000 mL

## 2017-07-03 MED ORDER — METOCLOPRAMIDE HCL 5 MG PO TABS: 5.0000 mg | ORAL_TABLET | Freq: Three times a day (TID) | ORAL | Status: DC | PRN

## 2017-07-03 MED ORDER — KETOROLAC TROMETHAMINE 30 MG/ML IJ SOLN
INTRAMUSCULAR | Status: DC | PRN
  Administered 2017-07-03: 30 mg

## 2017-07-03 MED ORDER — DOCUSATE SODIUM 100 MG PO CAPS: 100.0000 mg | ORAL_CAPSULE | Freq: Two times a day (BID) | ORAL | 0 refills | Status: AC

## 2017-07-03 MED ORDER — ONDANSETRON HCL 4 MG/2ML IJ SOLN: 4.0000 mg | Freq: Four times a day (QID) | INTRAMUSCULAR | Status: DC | PRN

## 2017-07-03 MED ORDER — PROPOFOL 10 MG/ML IV BOLUS
INTRAVENOUS | Status: DC | PRN
  Administered 2017-07-03: 20 mg via INTRAVENOUS
  Administered 2017-07-03: 10 mg via INTRAVENOUS

## 2017-07-03 MED ORDER — FENTANYL CITRATE (PF) 100 MCG/2ML IJ SOLN
INTRAMUSCULAR | Status: AC
  Administered 2017-07-03: 100 ug via INTRAVENOUS
  Filled 2017-07-03: qty 2

## 2017-07-03 MED ORDER — CHLORHEXIDINE GLUCONATE 4 % EX LIQD: 60.0000 mL | Freq: Once | CUTANEOUS | Status: DC

## 2017-07-03 MED ORDER — PHENOL 1.4 % MT LIQD: 1.0000 | OROMUCOSAL | Status: DC | PRN

## 2017-07-03 MED ORDER — BACLOFEN 10 MG PO TABS: 10.0000 mg | ORAL_TABLET | Freq: Three times a day (TID) | ORAL | 0 refills | Status: AC | PRN

## 2017-07-03 MED ORDER — ASPIRIN EC 325 MG PO TBEC
325.0000 mg | DELAYED_RELEASE_TABLET | Freq: Every day | ORAL | Status: DC
  Administered 2017-07-04: 325 mg via ORAL
  Filled 2017-07-03: qty 1

## 2017-07-03 MED ORDER — OMEPRAZOLE 20 MG PO CPDR: 20.0000 mg | DELAYED_RELEASE_CAPSULE | Freq: Every day | ORAL | 0 refills | Status: AC

## 2017-07-03 MED ORDER — ACETAMINOPHEN 650 MG RE SUPP
650.0000 mg | RECTAL | Status: DC | PRN
Start: 2017-07-03 — End: 2017-07-04

## 2017-07-03 MED ORDER — METOCLOPRAMIDE HCL 5 MG/ML IJ SOLN: 5.0000 mg | Freq: Three times a day (TID) | INTRAMUSCULAR | Status: DC | PRN

## 2017-07-03 MED ORDER — DIPHENHYDRAMINE HCL 50 MG/ML IJ SOLN
INTRAMUSCULAR | Status: DC | PRN
  Administered 2017-07-03: 25 mg via INTRAVENOUS

## 2017-07-03 MED ORDER — DEXTROSE 5 % IV SOLN: 500.0000 mg | Freq: Four times a day (QID) | INTRAVENOUS | Status: DC | PRN

## 2017-07-03 MED ORDER — MIDAZOLAM HCL 2 MG/2ML IJ SOLN
INTRAMUSCULAR | Status: AC
  Administered 2017-07-03: 2 mg via INTRAVENOUS
  Filled 2017-07-03: qty 2

## 2017-07-03 MED ORDER — ACETAMINOPHEN 500 MG PO TABS
ORAL_TABLET | ORAL | Status: AC
  Filled 2017-07-03: qty 2

## 2017-07-03 MED ORDER — ASPIRIN EC 325 MG PO TBEC: 325.0000 mg | DELAYED_RELEASE_TABLET | Freq: Every day | ORAL | 0 refills | Status: AC

## 2017-07-03 MED ORDER — BUPIVACAINE-EPINEPHRINE (PF) 0.5% -1:200000 IJ SOLN
INTRAMUSCULAR | Status: DC | PRN
  Administered 2017-07-03: 30 mL via PERINEURAL

## 2017-07-03 MED ORDER — BUPIVACAINE HCL (PF) 0.25 % IJ SOLN
INTRAMUSCULAR | Status: DC | PRN
  Administered 2017-07-03: 30 mL

## 2017-07-03 MED ORDER — SENNA 8.6 MG PO TABS
1.0000 | ORAL_TABLET | Freq: Two times a day (BID) | ORAL | Status: DC
  Administered 2017-07-03 – 2017-07-04 (×2): 8.6 mg via ORAL
  Filled 2017-07-03 (×2): qty 1

## 2017-07-03 MED ORDER — MIDAZOLAM HCL 2 MG/2ML IJ SOLN
2.0000 mg | Freq: Once | INTRAMUSCULAR | Status: AC
  Administered 2017-07-03: 2 mg via INTRAVENOUS

## 2017-07-03 MED ORDER — ONDANSETRON HCL 4 MG PO TABS: 4.0000 mg | ORAL_TABLET | Freq: Four times a day (QID) | ORAL | Status: DC | PRN

## 2017-07-03 MED ORDER — DIPHENHYDRAMINE HCL 12.5 MG/5ML PO ELIX: 12.5000 mg | ORAL_SOLUTION | ORAL | Status: DC | PRN

## 2017-07-03 MED ORDER — LACTATED RINGERS IV SOLN
INTRAVENOUS | Status: DC
  Administered 2017-07-03 (×2): via INTRAVENOUS

## 2017-07-03 MED ORDER — HYDROMORPHONE HCL 1 MG/ML IJ SOLN: 0.2500 mg | INTRAMUSCULAR | Status: DC | PRN

## 2017-07-03 MED ORDER — PRAMIPEXOLE DIHYDROCHLORIDE 0.25 MG PO TABS
0.5000 mg | ORAL_TABLET | Freq: Every day | ORAL | Status: DC
  Administered 2017-07-03: 0.5 mg via ORAL
  Filled 2017-07-03: qty 2

## 2017-07-03 MED ORDER — POLYETHYLENE GLYCOL 3350 17 G PO PACK: 17.0000 g | PACK | Freq: Every day | ORAL | Status: DC | PRN

## 2017-07-03 MED ORDER — PROMETHAZINE HCL 25 MG/ML IJ SOLN: 6.2500 mg | INTRAMUSCULAR | Status: DC | PRN

## 2017-07-03 MED ORDER — FENTANYL CITRATE (PF) 100 MCG/2ML IJ SOLN
100.0000 ug | Freq: Once | INTRAMUSCULAR | Status: AC
  Administered 2017-07-03: 100 ug via INTRAVENOUS

## 2017-07-03 MED ORDER — DOCUSATE SODIUM 100 MG PO CAPS
100.0000 mg | ORAL_CAPSULE | Freq: Two times a day (BID) | ORAL | Status: DC
  Administered 2017-07-03 – 2017-07-04 (×2): 100 mg via ORAL
  Filled 2017-07-03 (×2): qty 1

## 2017-07-03 MED ORDER — ACETAMINOPHEN 325 MG PO TABS: 650.0000 mg | ORAL_TABLET | ORAL | Status: DC | PRN

## 2017-07-03 MED ORDER — MENTHOL 3 MG MT LOZG: 1.0000 | LOZENGE | OROMUCOSAL | Status: DC | PRN

## 2017-07-03 MED ORDER — OXYCODONE HCL 5 MG PO TABS
5.0000 mg | ORAL_TABLET | ORAL | Status: DC | PRN
  Administered 2017-07-04: 5 mg via ORAL
  Filled 2017-07-03: qty 1

## 2017-07-03 MED ORDER — CEFAZOLIN SODIUM-DEXTROSE 2-4 GM/100ML-% IV SOLN
2.0000 g | INTRAVENOUS | Status: AC
  Administered 2017-07-03: 2 g via INTRAVENOUS

## 2017-07-03 MED ORDER — CEFAZOLIN SODIUM-DEXTROSE 2-4 GM/100ML-% IV SOLN
INTRAVENOUS | Status: AC
  Filled 2017-07-03: qty 100

## 2017-07-03 SURGICAL SUPPLY — 55 items
BANDAGE ESMARK 6X9 LF (GAUZE/BANDAGES/DRESSINGS) ×1 IMPLANT
BLADE SAG 18X100X1.27 (BLADE) ×3 IMPLANT
BNDG CMPR 9X6 STRL LF SNTH (GAUZE/BANDAGES/DRESSINGS) ×1
BNDG CMPR MED 10X6 ELC LF (GAUZE/BANDAGES/DRESSINGS) ×1
BNDG COHESIVE 6X5 TAN STRL LF (GAUZE/BANDAGES/DRESSINGS) ×3 IMPLANT
BNDG ELASTIC 6X10 VLCR STRL LF (GAUZE/BANDAGES/DRESSINGS) ×2 IMPLANT
BNDG ESMARK 6X9 LF (GAUZE/BANDAGES/DRESSINGS) ×3
BOWL SMART MIX CTS (DISPOSABLE) ×3 IMPLANT
CAPT KNEE TRIATH TK-4 ×2 IMPLANT
CLOSURE STERI-STRIP 1/2X4 (GAUZE/BANDAGES/DRESSINGS) ×1
CLSR STERI-STRIP ANTIMIC 1/2X4 (GAUZE/BANDAGES/DRESSINGS) ×3 IMPLANT
COVER SURGICAL LIGHT HANDLE (MISCELLANEOUS) ×3 IMPLANT
CUFF TOURNIQUET SINGLE 34IN LL (TOURNIQUET CUFF) ×3 IMPLANT
DRAPE EXTREMITY T 121X128X90 (DRAPE) ×3 IMPLANT
DRAPE HALF SHEET 40X57 (DRAPES) ×3 IMPLANT
DRAPE U-SHAPE 47X51 STRL (DRAPES) ×3 IMPLANT
DRSG MEPILEX BORDER 4X12 (GAUZE/BANDAGES/DRESSINGS) ×2 IMPLANT
DRSG MEPILEX BORDER 4X8 (GAUZE/BANDAGES/DRESSINGS) ×3 IMPLANT
DURAPREP 26ML APPLICATOR (WOUND CARE) ×6 IMPLANT
ELECT CAUTERY BLADE 6.4 (BLADE) ×3 IMPLANT
ELECT REM PT RETURN 9FT ADLT (ELECTROSURGICAL) ×3
ELECTRODE REM PT RTRN 9FT ADLT (ELECTROSURGICAL) ×1 IMPLANT
FACESHIELD WRAPAROUND (MASK) ×6 IMPLANT
FACESHIELD WRAPAROUND OR TEAM (MASK) ×2 IMPLANT
GLOVE BIO SURGEON STRL SZ7.5 (GLOVE) ×6 IMPLANT
GLOVE BIOGEL PI IND STRL 8 (GLOVE) ×2 IMPLANT
GLOVE BIOGEL PI INDICATOR 8 (GLOVE) ×4
GOWN STRL REUS W/ TWL LRG LVL3 (GOWN DISPOSABLE) ×2 IMPLANT
GOWN STRL REUS W/ TWL XL LVL3 (GOWN DISPOSABLE) ×1 IMPLANT
GOWN STRL REUS W/TWL LRG LVL3 (GOWN DISPOSABLE) ×6
GOWN STRL REUS W/TWL XL LVL3 (GOWN DISPOSABLE) ×3
HANDPIECE INTERPULSE COAX TIP (DISPOSABLE)
IMMOBILIZER KNEE 22 (SOFTGOODS) ×2 IMPLANT
IMMOBILIZER KNEE 22 UNIV (SOFTGOODS) ×3 IMPLANT
KIT BASIN OR (CUSTOM PROCEDURE TRAY) ×3 IMPLANT
KIT ROOM TURNOVER OR (KITS) ×3 IMPLANT
MANIFOLD NEPTUNE II (INSTRUMENTS) ×3 IMPLANT
NEEDLE 22X1 1/2 (OR ONLY) (NEEDLE) ×3 IMPLANT
NS IRRIG 1000ML POUR BTL (IV SOLUTION) ×3 IMPLANT
PACK TOTAL JOINT (CUSTOM PROCEDURE TRAY) ×3 IMPLANT
PAD ARMBOARD 7.5X6 YLW CONV (MISCELLANEOUS) ×3 IMPLANT
SET HNDPC FAN SPRY TIP SCT (DISPOSABLE) IMPLANT
SUCTION FRAZIER HANDLE 10FR (MISCELLANEOUS) ×2
SUCTION TUBE FRAZIER 10FR DISP (MISCELLANEOUS) ×1 IMPLANT
SUT MNCRL AB 4-0 PS2 18 (SUTURE) ×3 IMPLANT
SUT MON AB 2-0 CT1 27 (SUTURE) ×3 IMPLANT
SUT VIC AB 0 CT1 27 (SUTURE) ×3
SUT VIC AB 0 CT1 27XBRD ANBCTR (SUTURE) ×1 IMPLANT
SUT VIC AB 1 CT1 27 (SUTURE) ×9
SUT VIC AB 1 CT1 27XBRD ANBCTR (SUTURE) ×2 IMPLANT
SYR 50ML LL SCALE MARK (SYRINGE) ×3 IMPLANT
TOWEL OR 17X24 6PK STRL BLUE (TOWEL DISPOSABLE) ×3 IMPLANT
TOWEL OR 17X26 10 PK STRL BLUE (TOWEL DISPOSABLE) ×3 IMPLANT
TRAY CATH 16FR W/PLASTIC CATH (SET/KITS/TRAYS/PACK) IMPLANT
TRAY FOLEY BAG SILVER LF 14FR (CATHETERS) IMPLANT

## 2017-07-03 NOTE — Transfer of Care (Signed)
Immediate Anesthesia Transfer of Care Note  Patient: Katie [name]  Procedure(s) Performed: TOTAL LEFT  KNEE ARTHROPLASTY (Left Knee)  Patient Location: PACU  Anesthesia Type:Spinal  Level of Consciousness: awake, alert  and oriented  Airway & Oxygen Therapy: Patient Spontanous Breathing and Patient connected to nasal cannula oxygen  Post-op Assessment: Report given to RN and Post -op Vital signs reviewed and stable  Post vital signs: Reviewed and stable  Last Vitals:  Vitals:   07/03/17 1120 07/03/17 1125  BP: (!) 117/44 (!) 113/46  Pulse: 80 90  Resp: 16 19  Temp:    SpO2: 97% 98%    Last Pain:  Vitals:   07/03/17 1012  TempSrc: Oral      Patients Stated Pain Goal: 3 (56/[id number])  Complications: No apparent anesthesia complications

## 2017-07-03 NOTE — Evaluation (Signed)
Physical Therapy Evaluation Patient Details Name: Katie [name] MRN: [id number] DOB: 10/19/1945 Today's Date: 07/03/2017   History of Present Illness  Pt is a 71 y/o female s/p elective L TKA. PMH includes R TKA, and restless leg syndrome.   Clinical Impression  Pt is s/p surgery above with deficits below. PTA, pt was independent with mobility however, reports she was "hobbling" secondary to pain. Upon eval, pt presenting with decreased sensation, slight pain, and post op weakness. Had some L knee buckling during transfer to [facility] and then to chair even with use of L KI, therefore, mobility limited this session. Reports husband will be available to assist as needed upon d/c and has all necessary DME. Follow up recommendations per MD arrangements. Will continue to follow acutely to maximize functional mobility independence and safety.     Follow Up Recommendations DC plan and follow up therapy as arranged by surgeon;Supervision for mobility/OOB    Equipment Recommendations  None recommended by PT    Recommendations for Other Services       Precautions / Restrictions Precautions Precautions: Knee Precaution Booklet Issued: Yes (comment) Precaution Comments: Reviewed supine ther ex with pt.  Restrictions Weight Bearing Restrictions: Yes LLE Weight Bearing: Weight bearing as tolerated      Mobility  Bed Mobility Overal bed mobility: Needs Assistance Bed Mobility: Supine to Sit     Supine to sit: Supervision     General bed mobility comments: Supervision for safety.   Transfers Overall transfer level: Needs assistance Equipment used: Rolling walker (2 wheeled) Transfers: Sit to/from [facility] Sit to Stand: Min assist Stand pivot transfers: Min assist       General transfer comment: Min A for steadying during transfer. Slight knee buckling noted during transfer despite use of KI, therefore mobility limited to stand pivot to [facility] and then to chair. Verbal  cues for sequencing using RW throughout.   Ambulation/Gait             General Gait Details: NT   Stairs            Wheelchair Mobility    Modified Rankin (Stroke Patients Only)       Balance Overall balance assessment: Needs assistance Sitting-balance support: No upper extremity supported;Feet supported Sitting balance-Leahy Scale: Good     Standing balance support: Bilateral upper extremity supported;During functional activity Standing balance-Leahy Scale: Poor Standing balance comment: Reliant on UE support                              Pertinent Vitals/Pain Pain Assessment: Faces Faces Pain Scale: Hurts a little bit Pain Location: L knee  Pain Descriptors / Indicators: Aching;Grimacing;Operative site guarding Pain Intervention(s): Limited activity within patient's tolerance;Monitored during session;Repositioned    Home Living Family/patient expects to be discharged to:: Private residence Living Arrangements: Spouse/significant other Available Help at Discharge: Family;Available 24 hours/day Type of Home: House Home Access: Stairs to enter Entrance Stairs-Rails: None Entrance Stairs-Number of Steps: 1(threshold step ) Home Layout: Two level Home Equipment: Walker - 2 wheels;Bedside commode      Prior Function Level of Independence: Independent               Hand Dominance        Extremity/Trunk Assessment   Upper Extremity Assessment Upper Extremity Assessment: Defer to OT evaluation    Lower Extremity Assessment Lower Extremity Assessment: LLE deficits/detail LLE Deficits / Details: Reports some numbness in RLE.  Deficits consistent with post op pain and weakness. Able to perform ther ex below.     Cervical / Trunk Assessment Cervical / Trunk Assessment: Normal  Communication   Communication: No difficulties  Cognition Arousal/Alertness: Awake/alert Behavior During Therapy: WFL for tasks assessed/performed Overall  Cognitive Status: Within Functional Limits for tasks assessed                                        General Comments General comments (skin integrity, edema, etc.): Pt's husband present during session.     Exercises Total Joint Exercises Ankle Circles/Pumps: AROM;Both;20 reps Quad Sets: AROM;Left;10 reps Towel Squeeze: AROM;Both;10 reps   Assessment/Plan    PT Assessment Patient needs continued PT services  PT Problem List Decreased strength;Decreased range of motion;Decreased balance;Decreased mobility;Decreased knowledge of use of DME;Pain;Impaired sensation;Decreased knowledge of precautions       PT Treatment Interventions DME instruction;Stair training;Gait training;Functional mobility training;Therapeutic activities;Balance training;Therapeutic exercise;Neuromuscular re-education;Patient/family education    PT Goals (Current goals can be found in the Care Plan section)  Acute Rehab PT Goals Patient Stated Goal: to go home  PT Goal Formulation: With patient Time For Goal Achievement: 07/10/17 Potential to Achieve Goals: Good    Frequency 7X/week   Barriers to discharge        Co-evaluation               AM-PAC PT "6 Clicks" Daily Activity  Outcome Measure Difficulty turning over in bed (including adjusting bedclothes, sheets and blankets)?: None Difficulty moving from lying on back to sitting on the side of the bed? : A Little Difficulty sitting down on and standing up from a chair with arms (e.g., wheelchair, bedside commode, etc,.)?: Unable Help needed moving to and from a bed to chair (including a wheelchair)?: A Little Help needed walking in hospital room?: A Little Help needed climbing 3-5 steps with a railing? : A Lot 6 Click Score: 16    End of Session Equipment Utilized During Treatment: Gait belt;Left knee immobilizer Activity Tolerance: Patient tolerated treatment well Patient left: in chair;with call bell/phone within reach;with  family/visitor present Nurse Communication: Mobility status PT Visit Diagnosis: Other abnormalities of gait and mobility (R26.89);Pain Pain - Right/Left: Left Pain - part of body: Knee    Time: 1717-1740 PT Time Calculation (min) (ACUTE ONLY): 23 min   Charges:   PT Evaluation $PT Eval Low Complexity: 1 Low PT Treatments $Therapeutic Activity: 8-22 mins   PT G Codes:   PT G-Codes **NOT FOR INPATIENT CLASS** Functional Assessment Tool Used: AM-PAC 6 Clicks Basic Mobility;Clinical judgement Functional Limitation: Mobility: Walking and moving around Mobility: Walking and Moving Around Current Status ([id number]): At least 40 percent but less than 60 percent impaired, limited or restricted Mobility: Walking and Moving Around Goal Status [phone]): At least 1 percent but less than 20 percent impaired, limited or restricted    Leighton Ruff, PT, DPT  Acute Rehabilitation Services  Pager: [place] 07/03/2017, 5:57 PM

## 2017-07-03 NOTE — Anesthesia Postprocedure Evaluation (Signed)
Anesthesia Post Note  Patient: Katie [name]  Procedure(s) Performed: TOTAL LEFT  KNEE ARTHROPLASTY (Left Knee)     Patient location during evaluation: PACU Anesthesia Type: MAC Level of consciousness: awake and alert Pain management: pain level controlled Vital Signs Assessment: post-procedure vital signs reviewed and stable Respiratory status: spontaneous breathing and respiratory function stable Cardiovascular status: blood pressure returned to baseline and stable Postop Assessment: spinal receding Anesthetic complications: no    Last Vitals:  Vitals:   07/03/17 1545 07/03/17 1611  BP: (!) 142/71 128/84  Pulse: 80 90  Resp: 10 16  Temp:  (!) 36.2 C  SpO2: 98% 96%    Last Pain:  Vitals:   07/03/17 1012  TempSrc: Oral                 [name] DANIEL

## 2017-07-03 NOTE — Anesthesia Procedure Notes (Signed)
Spinal  Patient location during procedure: OR Start time: 07/03/2017 12:04 PM End time: 07/03/2017 12:15 PM Staffing Anesthesiologist: Duane Boston, MD Performed: anesthesiologist  Preanesthetic Checklist Completed: patient identified, surgical consent, pre-op evaluation, timeout performed, IV checked, risks and benefits discussed and monitors and equipment checked Spinal Block Patient position: sitting Prep: DuraPrep Patient monitoring: cardiac monitor, continuous pulse ox and blood pressure Approach: midline Location: L2-3 Injection technique: single-shot Needle Needle type: Pencan  Needle gauge: 24 G Needle length: 9 cm Additional Notes Functioning IV was confirmed and monitors were applied. Sterile prep and drape, including hand hygiene and sterile gloves were used. The patient was positioned and the spine was prepped. The skin was anesthetized with lidocaine.  Free flow of clear CSF was obtained prior to injecting local anesthetic into the CSF.  The spinal needle aspirated freely following injection.  The needle was carefully withdrawn.  The patient tolerated the procedure well.

## 2017-07-03 NOTE — Anesthesia Procedure Notes (Signed)
Anesthesia Regional Block: Adductor canal block   Pre-Anesthetic Checklist: ,, timeout performed, Correct Patient, Correct Site, Correct Laterality, Correct Procedure, Correct Position, site marked, Risks and benefits discussed,  Surgical consent,  Pre-op evaluation,  At surgeon's request and post-op pain management  Laterality: Left  Prep: chloraprep       Needles:  Injection technique: Single-shot  Needle Type: Stimulator Needle - 80     Needle Length: 10cm  Needle Gauge: 21     Additional Needles:   Narrative:  Start time: 07/03/2017 11:11 AM End time: 07/03/2017 11:21 AM Injection made incrementally with aspirations every 5 mL.  Performed by: Personally

## 2017-07-03 NOTE — Discharge Instructions (Signed)

## 2017-07-03 NOTE — Interval H&P Note (Signed)
History and Physical Interval Note:  07/03/2017 10:12 AM  Katie [name]  has presented today for surgery, with the diagnosis of OA LEFT KNEE  The various methods of treatment have been discussed with the patient and family. After consideration of risks, benefits and other options for treatment, the patient has consented to  Procedure(s): TOTAL LEFT  KNEE ARTHROPLASTY (Left) as a surgical intervention .  The patient's history has been reviewed, patient examined, no change in status, stable for surgery.  I have reviewed the patient's chart and labs.  Questions were answered to the patient's satisfaction.     Jannette Cotham D

## 2017-07-03 NOTE — Op Note (Signed)
DATE OF SURGERY:  07/03/2017 TIME: 1:09 PM  PATIENT NAME:  Katie [name]   AGE: 71 y.o.    PRE-OPERATIVE DIAGNOSIS:  OSTEOARTHRITIS LEFT KNEE  POST-OPERATIVE DIAGNOSIS:  Same  PROCEDURE:  Procedure(s): TOTAL LEFT  KNEE ARTHROPLASTY   SURGEON:  Empress Newmann D, MD   ASSISTANT:  Roxan Hockey, PA-C, he was present and scrubbed throughout the case, critical for completion in a timely fashion, and for retraction, instrumentation, and closure. Dr. Amada Jupiter MD   OPERATIVE IMPLANTS: [employer]ker Triathlon Posterior Stabilized. Press fit knee  Femur size 4, Tibia size 5, Patella size 32 3-peg oval button, with a 9 mm polyethylene insert.   PREOPERATIVE INDICATIONS:  Katie [name] is a 71 y.o. year old female with end stage bone on bone degenerative arthritis of the knee who failed conservative treatment, including injections, antiinflammatories, activity modification, and assistive devices, and had significant impairment of their activities of daily living, and elected for Total Knee Arthroplasty.   The risks, benefits, and alternatives were discussed at length including but not limited to the risks of infection, bleeding, nerve injury, stiffness, blood clots, the need for revision surgery, cardiopulmonary complications, among others, and they were willing to proceed.   OPERATIVE DESCRIPTION:  The patient was brought to the operative room and placed in a supine position.  General anesthesia was administered.  IV antibiotics were given.  The lower extremity was prepped and draped in the usual sterile fashion.  Time out was performed.  The leg was elevated and exsanguinated and the tourniquet was inflated.  Anterior approach was performed.  The patella was everted and osteophytes were removed.  The anterior horn of the medial and lateral meniscus was removed.   The distal femur was opened with the drill and the intramedullary distal femoral cutting jig was utilized, set at 5  degrees resecting 8 mm off the distal femur.  Care was taken to protect the collateral ligaments.  The distal femoral sizing jig was applied, taking care to avoid notching.  Then the 4-in-1 cutting jig was applied and the anterior and posterior femur was cut, along with the chamfer cuts.  All posterior osteophytes were removed.  The flexion gap was then measured and was symmetric with the extension gap.  Then the extramedullary tibial cutting jig was utilized making the appropriate cut using the anterior tibial crest as a reference building in appropriate posterior slope.  Care was taken during the cut to protect the medial and collateral ligaments.  The proximal tibia was removed along with the posterior horns of the menisci.  The PCL was sacrificed.    The extensor gap was measured and was approximately 54mm.    I completed the distal femoral preparation using the appropriate jig to prepare the box.  The patella was then measured, and cut with the saw.    The proximal tibia sized and prepared accordingly with the reamer and the punch, and then all components were trialed with the above sized poly insert.  The knee was found to have excellent balance and full motion.    The above named components were then impacted into place and Poly tibial piece and patella were inserted.  I was very happy with his stability and ROM  I performed a periarticular injection with marcaine and toradol  The knee was easily taken through a range of motion and the patella tracked well and the knee irrigated copiously and the parapatellar and subcutaneous tissue closed with vicryl, and monocryl with steri  strips for the skin.  The incision was dressed with sterile gauze and the tourniquet released and the patient was awakened and returned to the PACU in stable and satisfactory condition.  There were no complications.  Total tourniquet time was roughly 60 minutes.   POSTOPERATIVE PLAN: post op Abx, DVT px: SCD's,  TED's, Early ambulation and chemical px

## 2017-07-03 NOTE — Anesthesia Preprocedure Evaluation (Signed)
Anesthesia Evaluation  Patient identified by MRN, date of birth, ID band Patient awake    Reviewed: Allergy & Precautions, NPO status , Patient's Chart, lab work & pertinent test results  History of Anesthesia Complications (+) PONV and history of anesthetic complications  Airway Mallampati: II   Neck ROM: full    Dental no notable dental hx. (+) Dental Advisory Given   Pulmonary neg pulmonary ROS,    breath sounds clear to auscultation       Cardiovascular negative cardio ROS   Rhythm:regular Rate:Normal     Neuro/Psych negative neurological ROS  negative psych ROS   GI/Hepatic negative GI ROS, Neg liver ROS,   Endo/Other  negative endocrine ROS  Renal/GU negative Renal ROS     Musculoskeletal  (+) Arthritis ,   Abdominal   Peds  Hematology   Anesthesia Other Findings   Reproductive/Obstetrics                             Anesthesia Physical  Anesthesia Plan  ASA: II  Anesthesia Plan: MAC and Spinal   Post-op Pain Management:    Induction: Intravenous  PONV Risk Score and Plan: 2 and Propofol infusion and Ondansetron  Airway Management Planned: Simple Face Mask  Additional Equipment:   Intra-op Plan:   Post-operative Plan:   Informed Consent: I have reviewed the patients History and Physical, chart, labs and discussed the procedure including the risks, benefits and alternatives for the proposed anesthesia with the patient or authorized representative who has indicated his/her understanding and acceptance.   Dental advisory given  Plan Discussed with: CRNA, Anesthesiologist and Surgeon  Anesthesia Plan Comments:         Anesthesia Quick Evaluation

## 2017-07-03 NOTE — Progress Notes (Signed)
Orthopedic Tech Progress Note Patient Details:  Katie [name] Jun 07, 1946 [id number]  CPM Left Knee CPM Left Knee: On Left Knee Flexion (Degrees): 90 Left Knee Extension (Degrees): 0 Additional Comments: foot roll   Katie [name] 07/03/2017, 2:30 PM

## 2017-07-04 ENCOUNTER — Encounter (HOSPITAL_COMMUNITY): Payer: Self-pay | Admitting: Orthopedic Surgery

## 2017-07-04 DIAGNOSIS — M1712 Unilateral primary osteoarthritis, left knee: Secondary | ICD-10-CM | POA: Diagnosis not present

## 2017-07-04 NOTE — Progress Notes (Signed)
   Assessment / Plan: 1 Day Post-Op  S/P Procedure(s) (LRB): TOTAL LEFT  KNEE ARTHROPLASTY (Left) by Dr. Ernesta [name]. Katie [name] on 07/03/17  Principal Problem:   Primary osteoarthritis of left knee   Primary osteoarthritis left knee-status post total knee arthroplasty Advance diet Up with therapy D/C IV fluids Incentive Spirometry Elevate and apply ice CPM/bone foam Apply TED hose to the operative leg prior to discharge  Weight Bearing: Weight Bearing as Tolerated (WBAT)  Dressings: Maintain Mepilex.  VTE prophylaxis: Aspirin, SCDs, ambulation Dispo: Home with home health after therapy session this morning  Subjective: Patient reports pain as mild to moderate.  Tolerating diet.  Urinating.  No CP, SOB.  OOB walking in room.  Objective:   VITALS:   Vitals:   07/03/17 1611 07/03/17 1652 07/03/17 2110 07/04/17 0459  BP: 128/84 134/68 123/68 (!) 128/55  Pulse: 90 83 74 80  Resp: 16 16 16 16   Temp: (!) 97.2 F (36.2 C) 97.6 F (36.4 C) 97.6 F (36.4 C) 97.8 F (36.6 C)  TempSrc:  Oral Oral Oral  SpO2: 96% 97% 97% 98%  Weight:      Height:       CBC Latest Ref Rng & Units 06/20/2017 08/06/2015 08/05/2015  WBC 4.0 - 10.5 K/uL 7.0 12.2(H) 9.0  Hemoglobin 12.0 - 15.0 g/dL 13.9 12.0 11.9(L)  Hematocrit 36.0 - 46.0 % 42.6 35.9(L) 36.3  Platelets 150 - 400 K/uL 319 276 255   BMP Latest Ref Rng & Units 06/20/2017 08/06/2015 08/05/2015  Glucose 65 - 99 mg/dL 97 122(H) 141(H)  BUN 6 - 20 mg/dL 15 9 7   Creatinine 0.44 - 1.00 mg/dL 0.67 0.64 0.62  Sodium 135 - 145 mmol/L 138 138 136  Potassium 3.5 - 5.1 mmol/L 4.7 3.9 3.9  Chloride 101 - 111 mmol/L 105 106 105  CO2 22 - 32 mmol/L 26 24 25   Calcium 8.9 - 10.3 mg/dL 9.7 9.0 8.4(L)   Intake/Output      11/13 0701 - 11/14 0700 11/14 0701 - 11/15 0700   P.O. 240    I.V. (mL/kg) 1250 (16.7)    IV Piggyback 50    Total Intake(mL/kg) 1540 (20.6)    Urine (mL/kg/hr) 1000    Blood 100    Total Output 1100    Net +440         Urine Occurrence 1 x       Physical Exam: General: NAD.  Upright in bed.  Calm, conversant.  CPM in place Resp: No increased wob Cardio: regular rate and rhythm ABD soft Neurologically intact MSK Neurovascularly intact Sensation intact distally Feet warm Dorsiflexion/Plantar flexion intact Incision: dressing C/D/I   Katie Burly III, PA-C 07/04/2017, 7:22 AM

## 2017-07-04 NOTE — Discharge Summary (Signed)
Discharge Summary  Patient ID: Katie [name] MRN: [id number] DOB/AGE: 1946-07-01 71 y.o.  Admit date: 07/03/2017 Discharge date: 07/04/2017  Admission Diagnoses:  Primary osteoarthritis of left knee  Discharge Diagnoses:  Principal Problem:   Primary osteoarthritis of left knee   Past Medical History:  Diagnosis Date  . Arthritis   . PONV (postoperative nausea and vomiting)    hard to wake up  . Restless leg syndrome     Surgeries: Procedure(s): TOTAL LEFT  KNEE ARTHROPLASTY on 07/03/2017   Consultants (if any):   Discharged Condition: Improved  Hospital Course: ANGELICE PIECH is an 71 y.o. female who was admitted 07/03/2017 with a diagnosis of Primary osteoarthritis of left knee and went to the operating room on 07/03/2017 and underwent the above named procedures.    She was given perioperative antibiotics:  Anti-infectives (From admission, onward)   Start     Dose/Rate Route Frequency Ordered Stop   07/03/17 1800  ceFAZolin (ANCEF) IVPB 1 g/50 mL premix     1 g 100 mL/hr over 30 Minutes Intravenous Every 6 hours 07/03/17 1618 07/04/17 0442   07/03/17 1030  ceFAZolin (ANCEF) 2-4 GM/100ML-% IVPB    Comments:  Scronce, Trina   : cabinet override      07/03/17 1030 07/03/17 1218   07/03/17 1024  ceFAZolin (ANCEF) IVPB 2g/100 mL premix     2 g 200 mL/hr over 30 Minutes Intravenous On call to O.R. 07/03/17 1024 07/03/17 1218    .  She was given sequential compression devices, early ambulation, and aspirin for DVT prophylaxis.  She benefited maximally from the hospital stay and there were no complications.    Recent vital signs:  Vitals:   07/03/17 2110 07/04/17 0459  BP: 123/68 (!) 128/55  Pulse: 74 80  Resp: 16 16  Temp: 97.6 F (36.4 C) 97.8 F (36.6 C)  SpO2: 97% 98%    Recent laboratory studies:  Lab Results  Component Value Date   HGB 13.9 06/20/2017   HGB 12.0 08/06/2015   HGB 11.9 (L) 08/05/2015   Lab Results  Component Value Date   WBC  7.0 06/20/2017   PLT 319 06/20/2017   Lab Results  Component Value Date   INR 1.02 07/23/2015   Lab Results  Component Value Date   NA 138 06/20/2017   K 4.7 06/20/2017   CL 105 06/20/2017   CO2 26 06/20/2017   BUN 15 06/20/2017   CREATININE 0.67 06/20/2017   GLUCOSE 97 06/20/2017    Discharge Medications:   Allergies as of 07/04/2017      Reactions   Anesthetics, Amide Other (See Comments)   UNSPECIFIED REACTION    Other Other (See Comments)   sensitive to anesthesia **slow to wake up per patient** Pain Medications "knocks her out"      Medication List    STOP taking these medications   acetaminophen 325 MG tablet Commonly known as:  TYLENOL   apixaban 2.5 MG Tabs tablet Commonly known as:  ELIQUIS   bisacodyl 5 MG EC tablet Commonly known as:  DULCOLAX   ibuprofen 200 MG tablet Commonly known as:  ADVIL,MOTRIN   methocarbamol 500 MG tablet Commonly known as:  ROBAXIN     TAKE these medications   aspirin EC 325 MG tablet Take 1 tablet (325 mg total) daily by mouth. For 30 days post op for DVT Prophylaxis   baclofen 10 MG tablet Commonly known as:  LIORESAL Take 1 tablet (10 mg total) 3 (  three) times daily as needed by mouth for muscle spasms.   celecoxib 200 MG capsule Commonly known as:  CELEBREX Take 1 capsule (200 mg total) 2 (two) times daily by mouth. For 2 weeks post op.  Discontinue Ibuprofen when taking this medicine.   docusate sodium 100 MG capsule Commonly known as:  COLACE Take 1 capsule (100 mg total) 2 (two) times daily by mouth. To prevent constipation while taking pain medication.   omeprazole 20 MG capsule Commonly known as:  PRILOSEC Take 1 capsule (20 mg total) daily by mouth. 30 days for gastroprotection while taking Aspirin.   ondansetron 4 MG tablet Commonly known as:  ZOFRAN Take 1 tablet (4 mg total) every 8 (eight) hours as needed by mouth for nausea or vomiting.   oxyCODONE-acetaminophen 5-325 MG tablet Commonly known  as:  ROXICET Take 1-2 tablets every 4 (four) hours as needed by mouth for severe pain. What changed:  reasons to take this   pramipexole 0.5 MG tablet Commonly known as:  MIRAPEX Take 0.5 mg by mouth at bedtime.       Diagnostic Studies: Dg Knee Left Port  Result Date: 07/03/2017 CLINICAL DATA:  Status post left total knee arthroplasty. EXAM: PORTABLE LEFT KNEE - 1-2 VIEW COMPARISON:  None in PACs FINDINGS: AP and lateral views of the knee performed portably reveal placement of the prosthesis. The positioning of the components is good. The interface with the native bone appears normal. There is a moderate amount of soft tissue gas. IMPRESSION: There is no immediate postprocedure complication following left total knee joint prosthesis placement. Electronically Signed   By: David  Martinique M.D.   On: 07/03/2017 15:14    Disposition: 06-Home-Health Care Svc    Follow-up Information    Renette Butters, MD Follow up.   Specialty:  Orthopedic Surgery Contact information: [address] [place] [id number] [phone]            Signed: Prudencio Burly III PA-C 07/04/2017, 7:26 AM

## 2017-07-04 NOTE — Plan of Care (Signed)
PROGRESSING

## 2017-07-04 NOTE — Plan of Care (Signed)
OOB TO BR WITHOUT DIFFICULTY.

## 2017-07-04 NOTE — Progress Notes (Signed)
Physical Therapy Treatment Patient Details Name: Katie [name] MRN: [id number] DOB: Apr 12, 1946 Today's Date: 07/04/2017    History of Present Illness Pt is a 71 y/o female s/p elective L TKA. PMH includes R TKA, and restless leg syndrome.     PT Comments    Pt is progressing well with gait and mobility.  HEP reviewed and pt showed proficiency on curb step simulating home entry.  Due to d/c this PM.     Follow Up Recommendations  DC plan and follow up therapy as arranged by surgeon;Supervision for mobility/OOB     Equipment Recommendations  None recommended by PT    Recommendations for Other Services   NA     Precautions / Restrictions Precautions Precautions: Knee Precaution Booklet Issued: Yes (comment) Required Braces or Orthoses: Knee Immobilizer - Left Restrictions Weight Bearing Restrictions: Yes LLE Weight Bearing: Weight bearing as tolerated    Mobility  Bed Mobility Overal bed mobility: Needs Assistance Bed Mobility: Supine to Sit     Supine to sit: Supervision     General bed mobility comments: supervision for safety from flat bed  Transfers Overall transfer level: Needs assistance Equipment used: Rolling walker (2 wheeled) Transfers: Sit to/from Stand Sit to Stand: Supervision         General transfer comment: supervision for safety.   Ambulation/Gait Ambulation/Gait assistance: Min guard Ambulation Distance (Feet): 250 Feet Assistive device: Rolling walker (2 wheeled) Gait Pattern/deviations: Step-to pattern;Antalgic Gait velocity: decreased Gait velocity interpretation: Below normal speed for age/gender General Gait Details: Modereately antalgic gait pattern, verbal cues for upright posture, safe RW use.     Stairs Stairs: Yes   Stair Management: No rails;Forwards;With walker Number of Stairs: 1(x2) General stair comments: Pt able to demonstrate proficiency with one curb step simulating home entry, forwards with RW.  Education given  first time and pt was able to teach back method second time with supervision for safety.          Balance Overall balance assessment: Needs assistance Sitting-balance support: Feet supported;No upper extremity supported Sitting balance-Leahy Scale: Good     Standing balance support: Bilateral upper extremity supported;Single extremity supported;No upper extremity supported Standing balance-Leahy Scale: Fair                              Cognition Arousal/Alertness: Awake/alert Behavior During Therapy: WFL for tasks assessed/performed Overall Cognitive Status: Within Functional Limits for tasks assessed                                        Exercises Total Joint Exercises Ankle Circles/Pumps: AROM;Both;20 reps Quad Sets: AROM;Both;10 reps Towel Squeeze: AROM;Both;10 reps Heel Slides: AAROM;Left;10 reps Goniometric ROM: 8-95        Pertinent Vitals/Pain Pain Assessment: 0-10 Pain Score: 3  Pain Location: L knee  Pain Descriptors / Indicators: Aching;Grimacing;Operative site guarding Pain Intervention(s): Limited activity within patient's tolerance;Monitored during session;Repositioned           PT Goals (current goals can now be found in the care plan section) Acute Rehab PT Goals Patient Stated Goal: to get back to working out again Progress towards PT goals: Progressing toward goals    Frequency    7X/week      PT Plan Current plan remains appropriate       AM-PAC PT "6 Clicks" Daily Activity  Outcome Measure  Difficulty turning over in bed (including adjusting bedclothes, sheets and blankets)?: None Difficulty moving from lying on back to sitting on the side of the bed? : A Little Difficulty sitting down on and standing up from a chair with arms (e.g., wheelchair, bedside commode, etc,.)?: None Help needed moving to and from a bed to chair (including a wheelchair)?: None Help needed walking in hospital room?: None Help  needed climbing 3-5 steps with a railing? : None 6 Click Score: 23    End of Session Equipment Utilized During Treatment: Gait belt;Left knee immobilizer Activity Tolerance: Patient limited by pain Patient left: in chair;with call bell/phone within reach Nurse Communication: Mobility status PT Visit Diagnosis: Other abnormalities of gait and mobility (R26.89);Pain Pain - Right/Left: Left Pain - part of body: Knee     Time: 1042-1120 PT Time Calculation (min) (ACUTE ONLY): 38 min  Charges:  $Gait Training: 23-37 mins $Therapeutic Exercise: 8-22 mins                    G Codes:  Functional Assessment Tool Used: AM-PAC 6 Clicks Basic Mobility;Clinical judgement Functional Limitation: Mobility: Walking and moving around Mobility: Walking and Moving Around Goal Status [phone]): At least 1 percent but less than 20 percent impaired, limited or restricted Mobility: Walking and Moving Around Discharge Status [phone]): At least 1 percent but less than 20 percent impaired, limited or restricted  Macon Lesesne B. Ainsley Deakins, PT, DPT [phone]    07/04/2017, 12:49 PM

## 2017-07-13 ENCOUNTER — Other Ambulatory Visit (HOSPITAL_COMMUNITY): Payer: Self-pay

## 2018-01-21 ENCOUNTER — Other Ambulatory Visit: Payer: Self-pay | Admitting: Family Medicine

## 2018-01-21 DIAGNOSIS — Z1231 Encounter for screening mammogram for malignant neoplasm of breast: Secondary | ICD-10-CM

## 2018-03-15 ENCOUNTER — Ambulatory Visit
Admission: RE | Admit: 2018-03-15 | Discharge: 2018-03-15 | Disposition: A | Discharge status: Home / Self Care | Payer: Medicare Other | Source: Ambulatory Visit | Attending: Family Medicine | Admitting: Family Medicine

## 2018-03-15 DIAGNOSIS — Z1231 Encounter for screening mammogram for malignant neoplasm of breast: Secondary | ICD-10-CM

## 2019-09-20 ENCOUNTER — Ambulatory Visit: Payer: Medicare Other

## 2019-09-28 ENCOUNTER — Ambulatory Visit: Payer: Medicare PPO | Attending: Internal Medicine

## 2019-09-28 DIAGNOSIS — Z23 Encounter for immunization: Secondary | ICD-10-CM

## 2019-09-28 NOTE — Progress Notes (Signed)
   Covid-19 Vaccination Clinic  Name:  Katie [name]    MRN: [id number] DOB: 09/26/1945  09/28/2019  Ms. Katie [name] was observed post Covid-19 immunization for 15 minutes without incidence. She was provided with Vaccine Information Sheet and instruction to access the V-Safe system.   Ms. Katie [name] was instructed to call 911 with any severe reactions post vaccine: Marland Kitchen Difficulty breathing  . Swelling of your face and throat  . A fast heartbeat  . A bad rash all over your body  . Dizziness and weakness    Immunizations Administered    Name Date Dose VIS Date Route   Pfizer COVID-19 Vaccine 09/28/2019  9:30 AM 0.3 mL 08/01/2019 Intramuscular   Manufacturer: [place]   Lot: [id number]   [place]: [id number]

## 2019-10-11 ENCOUNTER — Ambulatory Visit: Payer: Medicare Other

## 2019-10-22 ENCOUNTER — Ambulatory Visit: Payer: Medicare PPO | Attending: Internal Medicine

## 2019-10-22 DIAGNOSIS — Z23 Encounter for immunization: Secondary | ICD-10-CM | POA: Insufficient documentation

## 2019-10-22 NOTE — Progress Notes (Signed)
   Covid-19 Vaccination Clinic  Name:  LAURIANNE QUERTERMOUS    MRN: [id number] DOB: 04-14-1946  10/22/2019  Ms. Sjoblom was observed post Covid-19 immunization for 15 minutes without incident. She was provided with Vaccine Information Sheet and instruction to access the V-Safe system.   Ms. Babiak was instructed to call 911 with any severe reactions post vaccine: Marland Kitchen Difficulty breathing  . Swelling of face and throat  . A fast heartbeat  . A bad rash all over body  . Dizziness and weakness   Immunizations Administered    Name Date Dose VIS Date Route   Pfizer COVID-19 Vaccine 10/22/2019  4:16 PM 0.3 mL 08/01/2019 Intramuscular   Manufacturer: [place]   Lot: [id number]   [place]: [id number]
